# Patient Record
Sex: Female | Born: 1990 | Race: White | Hispanic: No | Marital: Single | Smoking: Never smoker
Health system: Southern US, Community
[De-identification: ages and names within clinical notes are randomized; demographics above are authoritative.]

## PROBLEM LIST (undated history)

## (undated) DIAGNOSIS — Z8669 Personal history of other diseases of the nervous system and sense organs: Secondary | ICD-10-CM

## (undated) DIAGNOSIS — F840 Autistic disorder: Secondary | ICD-10-CM

## (undated) HISTORY — PX: TYMPANOSTOMY TUBE PLACEMENT: SHX32

## (undated) HISTORY — PX: TONSILLECTOMY AND ADENOIDECTOMY: SUR1326

## (undated) HISTORY — DX: Personal history of other diseases of the nervous system and sense organs: Z86.69

---

## 2013-05-08 ENCOUNTER — Encounter: Payer: Self-pay | Admitting: *Deleted

## 2013-05-08 ENCOUNTER — Emergency Department
Admission: EM | Admit: 2013-05-08 | Discharge: 2013-05-08 | Disposition: A | Discharge status: Home / Self Care | Payer: Managed Care, Other (non HMO) | Source: Home / Self Care | Attending: Emergency Medicine | Admitting: Emergency Medicine

## 2013-05-08 DIAGNOSIS — H66009 Acute suppurative otitis media without spontaneous rupture of ear drum, unspecified ear: Secondary | ICD-10-CM

## 2013-05-08 DIAGNOSIS — H66001 Acute suppurative otitis media without spontaneous rupture of ear drum, right ear: Secondary | ICD-10-CM

## 2013-05-08 DIAGNOSIS — J0100 Acute maxillary sinusitis, unspecified: Secondary | ICD-10-CM

## 2013-05-08 HISTORY — DX: Autistic disorder: F84.0

## 2013-05-08 MED ORDER — AMOXICILLIN-POT CLAVULANATE 400-57 MG/5ML PO SUSR: 500.0000 mg | Freq: Three times a day (TID) | ORAL | Status: AC

## 2013-05-08 NOTE — ED Notes (Signed)
History obtained by patient's mother. Fahmida's mother reports she has been c/o ear pain x 3 days. She is also congested with runny nose. Denies fever.

## 2013-05-09 NOTE — ED Provider Notes (Signed)
History     CSN: [id number]  Arrival date & time 05/08/13  1120   First MD Initiated Contact with Patient 05/08/13 1134      Chief Complaint  Patient presents with  . Otalgia     History provided by: Patient's mother, as patient is autistic.   URI HISTORY  Crystal Cline is a 22 y.o. female ,onset of cold symptoms for 3 days. Sinus congestion, colored rhinorrhea, right ear pain. Minimal sore throat  Tried Mucinex, not helping. Mother tried Nettie pot, but patient would not tolerate. Family moved here recently from [place]. History of ear wax buildup, was seeing an ENT for periodic ear wax removal.  No chills/sweats No Fever  +  Nasal congestion +  Discolored Post-nasal drainage Positive, mild, sinus pain/pressure Mild sore throat  No cough No wheezing No chest congestion No hemoptysis No shortness of breath No pleuritic pain  No itchy/red eyes No earache  No nausea No vomiting No abdominal pain No diarrhea  No skin rashes + mild Fatigue No myalgias No headache   Past Medical History  Diagnosis Date  . Autism     Past Surgical History  Procedure Laterality Date  . Tonsillectomy    . Tonsillectomy and adenoidectomy      History reviewed. No pertinent family history.  History  Substance Use Topics  . Smoking status: Never Smoker   . Smokeless tobacco: Never Used  . Alcohol Use: No    OB History   Grav Para Term Preterm Abortions TAB SAB Ect Mult Living                  Review of Systems  All other systems reviewed and are negative.  see above  Allergies  Review of patient's allergies indicates no known allergies.  Home Medications   Current Outpatient Rx  Name  Route  Sig  Dispense  Refill  . amoxicillin-clavulanate (AUGMENTIN) 400-57 MG/5ML suspension   Oral   Take 6.3 mLs (500 mg total) by mouth 3 (three) times daily. Take for 10 days. Take with food.   200 mL   0     BP 112/79  Pulse 111  Temp(Src) 98.6 F (37 C) (Oral)  Resp  16  Ht 5' 1.5" (1.562 m)  Wt 142 lb (64.411 kg)  BMI 26.4 kg/m2  SpO2 97%  Physical Exam  Nursing note and vitals reviewed. Constitutional: She is oriented to person, place, and time. She appears well-developed and well-nourished. No distress.  HENT:  Head: Normocephalic and atraumatic.  Right Ear: External ear and ear canal normal. Tympanic membrane is injected, erythematous and bulging.  Left Ear: Tympanic membrane, external ear and ear canal normal.  Nose: Mucosal edema and rhinorrhea present.  Mouth/Throat: Oropharynx is clear and moist. No oral lesions.  Eyes: Conjunctivae are normal. No scleral icterus.  Neck: Neck supple.  Cardiovascular: Normal rate, regular rhythm and normal heart sounds.   Pulmonary/Chest: Effort normal and breath sounds normal.  Lymphadenopathy:    She has no cervical adenopathy.  Neurological: She is alert and oriented to person, place, and time.  Skin: Skin is warm and dry.   Nose: Edematous mucosa, seromucoid yellow drainage. No foreign body. Mild tenderness bilateral maxillary. Left ear: Mild amount of wax in canal, but TM normal ED Course  Procedures (including critical care time)  Labs Reviewed - No data to display No results found.   1. Right acute suppurative otitis media   2. Sinusitis, acute maxillary  MDM  Diagnosis and treatment options discussed with mother. After risks, benefits, alternatives discussed, prescribed Augmentin, 500 mg 3 times a day p.c. x10 days. Other pain reduction methods discussed. Mother declined using decongestants because of side effects in the past. I offered prescription for steroid nasal spray, but mother declined. Advised  one spray of Afrin in each nostril each night for the next 3 nights, but not for chronic use and precautions discussed. Followup with PCP if no better in one week, sooner if worse or new symptoms. Also, advised to establish with ENT because of her history of chronic ear wax  buildup.--Mother voiced understanding and agreement with above.        Lajean Manes, MD 05/09/13 [phone]

## 2013-05-19 ENCOUNTER — Encounter: Payer: Self-pay | Admitting: Family Medicine

## 2013-05-19 ENCOUNTER — Ambulatory Visit (INDEPENDENT_AMBULATORY_CARE_PROVIDER_SITE_OTHER): Payer: Managed Care, Other (non HMO) | Admitting: Family Medicine

## 2013-05-19 VITALS — BP 110/79 | HR 91 | Ht 62.0 in | Wt 142.0 lb

## 2013-05-19 DIAGNOSIS — B373 Candidiasis of vulva and vagina: Secondary | ICD-10-CM

## 2013-05-19 DIAGNOSIS — B3731 Acute candidiasis of vulva and vagina: Secondary | ICD-10-CM

## 2013-05-19 DIAGNOSIS — H6120 Impacted cerumen, unspecified ear: Secondary | ICD-10-CM

## 2013-05-19 DIAGNOSIS — H6123 Impacted cerumen, bilateral: Secondary | ICD-10-CM

## 2013-05-19 MED ORDER — TERCONAZOLE 0.8 % VA CREA: 1.0000 | TOPICAL_CREAM | Freq: Every day | VAGINAL | Status: DC

## 2013-05-19 MED ORDER — FLUCONAZOLE 150 MG PO TABS: 150.0000 mg | ORAL_TABLET | Freq: Once | ORAL | Status: DC

## 2013-05-19 NOTE — Patient Instructions (Addendum)
Cerumen Plug A cerumen plug is having too much wax in your ear canal. The outer ear canal is lined with hairs and glands that secrete wax. This wax is called cerumen. This protects the ear canal. It also helps prevent material from entering the ear. Too much wax can cause a feeling of fullness in the ears, decreased hearing, ringing in the ears, or an earache. Sometimes your caregiver will remove a cerumen plug with an instrument called a curette. Or he/she may flush the ear canal with warm water from a syringe to remove the wax. You may simply be sent home to follow the home care instructions below for wax removal. Generally ear wax does not have to be removed unless it is causing a problem such as one of those listed above. When too much wax is causing a problem, the following are a few home remedies which can be used to help this problem. HOME CARE INSTRUCTIONS   Put a couple drops of glycerin, baby oil, or mineral oil in the ear a couple times of day. Do this every day for several days. After putting the drops in, you will need to lay with the affected ear pointing up for a couple minutes. This allows the drops to remain in the canal and run down to the area of wax blockage. This will soften the wax plug. It may also make your hearing worse as the wax softens and blocks the canal even more.  After a couple days, you may gently flush the ear canal with warm water from a syringe. Do this by pulling your ear up and back with your head tilted slightly forward and towards a pan to catch the water. This is most easily done with a helper. You can also accomplish the same thing by letting the shower beat into your ear canal to wash the wax out. Sometimes this will not be immediately successful. You will have to return to the first step of using the oil to further soften the wax. Then resume washing the ear canal out with a syringe or shower.  Following removal of the wax, put ten to twenty drops of rubbing  alcohol into the outer ears. This will dry the canal and prevent an infection.  Do not irrigate or wash out your ears if you have had a perforated ear drum or mastoid surgery. SEEK IMMEDIATE MEDICAL CARE IF:   You are unsuccessful with the above instructions for home care.  You develop ear pain or drainage from the ear. MAKE SURE YOU:   Understand these instructions.  Will watch your condition.  Will get help right away if you are not doing well or get worse. Document Released: 08/14/2001 Document Revised: 02/11/2012 Document Reviewed: 11/10/2008 [employer] Patient Information 2014 [employer].  

## 2013-05-19 NOTE — Progress Notes (Signed)
  Subjective:    Patient ID: Crystal Cline, female    DOB: August 17, 1991, 22 y.o.   MRN: [id number]  HPI  Sahory is here today with her mother Clydie Braun) to establish care with our practice.  She and her family moved to the area recently from [place].  Her father was transferred here with [facility].  She was recently seen at the [facility] for ear pain and was told that she had a ruptured eardrum.   She was given a round of Amoxicilin and was instructed to follow up with a PCP.    Review of Systems  HENT: Positive for ear pain and ear discharge. Negative for hearing loss.      Past Medical History  Diagnosis Date  . Autism   . History of impacted cerumen      Family History  Problem Relation Age of Onset  . Allergies Mother   . Allergies Father   . Cancer Maternal Grandfather   . Cancer Paternal Grandmother   . Cancer Paternal Grandfather    History   Social History Narrative   Marital Status: Single    Children:  None    Pets: None    Living Situation: Lives with parents Loraine Leriche and Clydie Braun)    Education:  She was home schooled due to her special needs (Autism)    Tobacco Use/Exposure:  None    Alcohol Use: None   Drug Use:  None   Diet:  Regular   Exercise:  Bicycle  (6 miles daily- 6 x per week)     Hobbies: Puzzles and Gardening                   Objective:   Physical Exam  Constitutional: She appears well-nourished.  HENT:  Cerumen is present in both ear canals   Eyes: Conjunctivae are normal.  Neck: Neck supple. No thyromegaly present.  Cardiovascular: Normal rate, regular rhythm and normal heart sounds.   Pulmonary/Chest: Effort normal and breath sounds normal.  Skin: Skin is warm and dry.  Psychiatric:  She is autistic (very sweet and happy). She is cooperative.            Assessment & Plan:

## 2013-06-27 ENCOUNTER — Encounter: Payer: Self-pay | Admitting: Family Medicine

## 2013-06-27 DIAGNOSIS — H6123 Impacted cerumen, bilateral: Secondary | ICD-10-CM | POA: Insufficient documentation

## 2013-06-27 DIAGNOSIS — B373 Candidiasis of vulva and vagina: Secondary | ICD-10-CM | POA: Insufficient documentation

## 2013-06-27 DIAGNOSIS — B3731 Acute candidiasis of vulva and vagina: Secondary | ICD-10-CM | POA: Insufficient documentation

## 2013-06-27 NOTE — Assessment & Plan Note (Signed)
Crystal Cline has had this problem for many years and has previously been followed by an ENT to remove her cerumen.  It is located deeper than we should attempt so mom will check with their insurance and follow up with an ENT.

## 2013-06-27 NOTE — Assessment & Plan Note (Signed)
Since she has just completed an antibiotic, mom is to watch her for any sign of a yeast infection.

## 2013-10-05 ENCOUNTER — Encounter (INDEPENDENT_AMBULATORY_CARE_PROVIDER_SITE_OTHER): Payer: Self-pay

## 2013-10-05 ENCOUNTER — Ambulatory Visit (INDEPENDENT_AMBULATORY_CARE_PROVIDER_SITE_OTHER): Payer: Managed Care, Other (non HMO) | Admitting: Family Medicine

## 2013-10-05 ENCOUNTER — Encounter: Payer: Self-pay | Admitting: Family Medicine

## 2013-10-05 VITALS — BP 100/68 | HR 96 | Resp 16 | Wt 146.0 lb

## 2013-10-05 DIAGNOSIS — Z124 Encounter for screening for malignant neoplasm of cervix: Secondary | ICD-10-CM

## 2013-10-05 DIAGNOSIS — N921 Excessive and frequent menstruation with irregular cycle: Secondary | ICD-10-CM

## 2013-10-05 DIAGNOSIS — Z01419 Encounter for gynecological examination (general) (routine) without abnormal findings: Secondary | ICD-10-CM | POA: Insufficient documentation

## 2013-10-05 DIAGNOSIS — N920 Excessive and frequent menstruation with regular cycle: Secondary | ICD-10-CM

## 2013-10-05 NOTE — Progress Notes (Signed)
  Subjective:    Patient ID: Crystal Cline, female    DOB: 1990-12-27, 22 y.o.   MRN: [id number]  HPI  Crystal Cline is here today with her mother to discuss her menstrual cycles.  They are very long and she has spotting in between.  She has had this problem for years. Mom is concerned because her excessive bleeding seems to be worsening.  Mom would like for [facility] to have a pelvic exam.  She is concerned about Abella because of her family history of cervical vaginal and uterine problems.    Review of Systems  Constitutional: Negative.   HENT: Negative.   Eyes: Negative.   Respiratory: Negative.   Cardiovascular: Negative.   Gastrointestinal: Negative.   Endocrine: Negative.   Genitourinary: Positive for vaginal bleeding.  Musculoskeletal: Negative.   Skin: Negative.   Allergic/Immunologic: Negative.   Neurological: Negative.   Hematological: Negative.   Psychiatric/Behavioral: Negative.      Past Medical History  Diagnosis Date  . Autism   . History of impacted cerumen      Family History  Problem Relation Age of Onset  . Allergies Mother   . Allergies Father   . Cancer Maternal Grandfather   . Cancer Paternal Grandmother   . Cancer Paternal Grandfather      History   Social History Narrative   Marital Status: Single    Children:  None    Pets: None    Living Situation: Lives with parents Loraine Leriche and Clydie Braun)    Education:  She was home schooled due to her special needs (Autism)    Tobacco Use/Exposure:  None    Alcohol Use: None   Drug Use:  None   Diet:  Regular   Exercise:  Bicycle  (6 miles daily- 6 x per week)     Hobbies: Puzzles and Gardening                 Objective:   Physical Exam  Vitals reviewed. Constitutional: She is oriented to person, place, and time. She appears well-developed and well-nourished.  HENT:  Head: Normocephalic and atraumatic.  Right Ear: External ear normal.  Left Ear: External ear normal.  Nose: Nose normal.   Mouth/Throat: Oropharynx is clear and moist.  Eyes: Conjunctivae are normal. Pupils are equal, round, and reactive to light. No scleral icterus.  Neck: Normal range of motion. Neck supple. No thyromegaly present.  Cardiovascular: Normal rate, regular rhythm, normal heart sounds and intact distal pulses.  Exam reveals no gallop and no friction rub.   No murmur heard. Pulmonary/Chest: Effort normal and breath sounds normal.  Abdominal: Soft. Bowel sounds are normal.  Genitourinary: Vagina normal and uterus normal.  Musculoskeletal: Normal range of motion. She exhibits no edema and no tenderness.  Lymphadenopathy:    She has no cervical adenopathy.  Neurological: She is alert and oriented to person, place, and time. She has normal reflexes.  Skin: Skin is warm and dry.  Psychiatric: She has a normal mood and affect. Her behavior is normal. Judgment and thought content normal.      Assessment & Plan:    Jaxon was seen today for gynecologic exam.  Diagnoses and associated orders for this visit:  Routine gynecological examination  Screening for malignant neoplasm of the cervix - Cytology - PAP  Menorrhagia with irregular cycle Comments: We discussed hormonal options to help control her bleeding.

## 2013-10-25 ENCOUNTER — Other Ambulatory Visit (HOSPITAL_COMMUNITY)
Admission: RE | Admit: 2013-10-25 | Discharge: 2013-10-25 | Disposition: A | Discharge status: Home / Self Care | Payer: Managed Care, Other (non HMO) | Source: Ambulatory Visit | Attending: Family Medicine | Admitting: Family Medicine

## 2013-12-06 DIAGNOSIS — Z01419 Encounter for gynecological examination (general) (routine) without abnormal findings: Secondary | ICD-10-CM | POA: Insufficient documentation

## 2013-12-06 DIAGNOSIS — N921 Excessive and frequent menstruation with irregular cycle: Secondary | ICD-10-CM | POA: Insufficient documentation

## 2013-12-06 DIAGNOSIS — Z124 Encounter for screening for malignant neoplasm of cervix: Secondary | ICD-10-CM | POA: Insufficient documentation

## 2014-01-11 ENCOUNTER — Ambulatory Visit: Payer: Managed Care, Other (non HMO) | Admitting: Family Medicine

## 2014-01-11 ENCOUNTER — Other Ambulatory Visit (INDEPENDENT_AMBULATORY_CARE_PROVIDER_SITE_OTHER): Payer: Managed Care, Other (non HMO) | Admitting: *Deleted

## 2014-01-11 DIAGNOSIS — M25559 Pain in unspecified hip: Secondary | ICD-10-CM

## 2014-01-11 LAB — POCT URINALYSIS DIPSTICK
Bilirubin, UA: NEGATIVE
Blood, UA: NEGATIVE
Glucose, UA: NEGATIVE
Ketones, UA: NEGATIVE
Leukocytes, UA: NEGATIVE
Nitrite, UA: NEGATIVE
Protein, UA: NEGATIVE
Spec Grav, UA: 1.015
Urobilinogen, UA: NEGATIVE
pH, UA: 6

## 2014-01-12 ENCOUNTER — Ambulatory Visit: Payer: Managed Care, Other (non HMO) | Admitting: Family Medicine

## 2014-01-12 ENCOUNTER — Ambulatory Visit (INDEPENDENT_AMBULATORY_CARE_PROVIDER_SITE_OTHER): Payer: Managed Care, Other (non HMO) | Admitting: Physician Assistant

## 2014-01-12 ENCOUNTER — Encounter: Payer: Self-pay | Admitting: Physician Assistant

## 2014-01-12 VITALS — BP 96/62 | HR 83 | Ht 62.0 in | Wt 138.0 lb

## 2014-01-12 DIAGNOSIS — F4322 Adjustment disorder with anxiety: Secondary | ICD-10-CM

## 2014-01-12 DIAGNOSIS — N939 Abnormal uterine and vaginal bleeding, unspecified: Secondary | ICD-10-CM

## 2014-01-12 DIAGNOSIS — N926 Irregular menstruation, unspecified: Secondary | ICD-10-CM

## 2014-01-12 DIAGNOSIS — R351 Nocturia: Secondary | ICD-10-CM

## 2014-01-12 DIAGNOSIS — F840 Autistic disorder: Secondary | ICD-10-CM

## 2014-01-12 DIAGNOSIS — F88 Other disorders of psychological development: Secondary | ICD-10-CM | POA: Insufficient documentation

## 2014-01-12 DIAGNOSIS — L680 Hirsutism: Secondary | ICD-10-CM

## 2014-01-12 DIAGNOSIS — M2629 Other anomalies of dental arch relationship: Secondary | ICD-10-CM

## 2014-01-12 LAB — CBC WITH DIFFERENTIAL/PLATELET
BASOS PCT: 0 % (ref 0–1)
Basophils Absolute: 0 10*3/uL (ref 0.0–0.1)
EOS ABS: 0.1 10*3/uL (ref 0.0–0.7)
Eosinophils Relative: 2 % (ref 0–5)
HCT: 41.6 % (ref 36.0–46.0)
HEMOGLOBIN: 14 g/dL (ref 12.0–15.0)
LYMPHS ABS: 2.6 10*3/uL (ref 0.7–4.0)
Lymphocytes Relative: 47 % — ABNORMAL HIGH (ref 12–46)
MCH: 30.1 pg (ref 26.0–34.0)
MCHC: 33.7 g/dL (ref 30.0–36.0)
MCV: 89.5 fL (ref 78.0–100.0)
Monocytes Absolute: 0.4 10*3/uL (ref 0.1–1.0)
Monocytes Relative: 7 % (ref 3–12)
NEUTROS ABS: 2.4 10*3/uL (ref 1.7–7.7)
NEUTROS PCT: 44 % (ref 43–77)
PLATELETS: 303 10*3/uL (ref 150–400)
RBC: 4.65 MIL/uL (ref 3.87–5.11)
RDW: 14 % (ref 11.5–15.5)
WBC: 5.6 10*3/uL (ref 4.0–10.5)

## 2014-01-12 LAB — POCT URINALYSIS DIPSTICK
Bilirubin, UA: NEGATIVE
Glucose, UA: NEGATIVE
Ketones, UA: NEGATIVE
Nitrite, UA: NEGATIVE
PROTEIN UA: NEGATIVE
UROBILINOGEN UA: 0.2
pH, UA: 5

## 2014-01-12 LAB — COMPLETE METABOLIC PANEL WITH GFR
ALT: 11 U/L (ref 0–35)
AST: 13 U/L (ref 0–37)
Albumin: 4.5 g/dL (ref 3.5–5.2)
Alkaline Phosphatase: 52 U/L (ref 39–117)
BILIRUBIN TOTAL: 0.4 mg/dL (ref 0.2–1.2)
BUN: 12 mg/dL (ref 6–23)
CALCIUM: 9.7 mg/dL (ref 8.4–10.5)
CHLORIDE: 106 meq/L (ref 96–112)
CO2: 23 meq/L (ref 19–32)
CREATININE: 0.55 mg/dL (ref 0.50–1.10)
GLUCOSE: 81 mg/dL (ref 70–99)
Potassium: 4 mEq/L (ref 3.5–5.3)
Sodium: 137 mEq/L (ref 135–145)
Total Protein: 7.1 g/dL (ref 6.0–8.3)

## 2014-01-12 LAB — LIPID PANEL
CHOL/HDL RATIO: 3.6 ratio
Cholesterol: 178 mg/dL (ref 0–200)
HDL: 49 mg/dL (ref >39)
LDL CALC: 100 mg/dL — AB (ref 0–99)
Triglycerides: 143 mg/dL (ref <150)
VLDL: 29 mg/dL (ref 0–40)

## 2014-01-12 MED ORDER — NITROFURANTOIN MONOHYD MACRO 100 MG PO CAPS: 100.0000 mg | ORAL_CAPSULE | Freq: Two times a day (BID) | ORAL | Status: DC

## 2014-01-12 NOTE — Progress Notes (Signed)
Subjective:    Patient ID: Crystal Cline, female    DOB: 1991-04-07, 23 y.o.   MRN: [id number]  HPI Pt is a 23 yo female who presents to the clinic with her mother to establish care. Pt does have developmental delay and characterized by autism. Her mother does most of the talking for her. Mother has a few concerns today. Pt does not take any medications daily.   . Active Ambulatory Problems    Diagnosis Date Noted  . Menorrhagia with irregular cycle 12/06/2013  . Global developmental delay 01/12/2014  . Autism 01/12/2014  . Adjustment disorder with anxious mood 01/12/2014  . Overbite 01/12/2014   Resolved Ambulatory Problems    Diagnosis Date Noted  . Impacted cerumen of both ears 06/27/2013  . Candidiasis of vulva 06/27/2013  . Routine gynecological examination 12/06/2013  . Screening for malignant neoplasm of the cervix 12/06/2013   Past Medical History  Diagnosis Date  . History of impacted cerumen    . History   Social History  . Marital Status: Single    Spouse Name: N/A    Number of Children: N/A  . Years of Education: N/A   Occupational History  . Not on file.   Social History Main Topics  . Smoking status: Never Smoker   . Smokeless tobacco: Never Used  . Alcohol Use: No  . Drug Use: No  . Sexual Activity: No   Other Topics Concern  . Not on file   Social History Narrative   Marital Status: Single    Children:  None    Pets: None    Living Situation: Lives with parents [name] and [name])    Education:  She was home schooled due to her special needs (Autism)    Tobacco Use/Exposure:  None    Alcohol Use: None   Drug Use:  None   Diet:  Regular   Exercise:  Bicycle  (6 miles daily- 6 x per week)     Hobbies: Puzzles and Gardening             . Family History  Problem Relation Age of Onset  . Allergies Mother   . Allergies Father   . Cancer Maternal Grandfather   . Cancer Paternal Grandmother   . Cancer Paternal Grandfather     Pt has  been having some concerning urinary symptoms. For the last 3 months pt has been waking up multiple times a night and urinating. Pt declines any burning or pain until the last week or so. Pt does not communicate well but has been holding her lower abdomen recently. Her urinary symptoms seem to be worse at night. Now she complains of not being able to urinate quite as much. Every time she urinates it is a struggle and then a little bit comes out. Denies any back pain, fever, chills, nausea, vomiting. She has had a headache for a day or so. Denies any vision changes. Not tried anything to make better and nothing makes worse. Mother is concerned due to family hx of ovarian cancer and diabetes. Pt is also having irregular periods. Most of the time they are too early sometimes too late. Denies any pelvic pain or discharge. Mother states she does have some abnormal hair growth on chin.  Review of Systems  All other systems reviewed and are negative.       Objective:   Physical Exam  Constitutional: She is oriented to person, place, and time. She appears well-developed and well-nourished.  HENT:  Head: Normocephalic and atraumatic.  Eyes: Conjunctivae and EOM are normal. Pupils are equal, round, and reactive to light. Right eye exhibits no discharge. Left eye exhibits no discharge.  Neck: Normal range of motion. Neck supple. No thyromegaly present.  Cardiovascular: Normal rate, regular rhythm and normal heart sounds.   Pulmonary/Chest: Effort normal and breath sounds normal. She has no wheezes.  NO CVA tenderness.   Abdominal: Soft. Bowel sounds are normal. She exhibits no distension and no mass. There is no tenderness. There is no rebound and no guarding.  Lymphadenopathy:    She has no cervical adenopathy.  Neurological: She is alert and oriented to person, place, and time.  Skin: Skin is dry.  Psychiatric: She has a normal mood and affect. Her behavior is normal.  Pt did not talk. Mother did all  talking.           Assessment & Plan:  Excessive urination at night/low urine output- UA positive for blood and pus. No glucose in urine. Would still like urine culture and mircoscopic. Will treat for infection since symptomatic with macrobid. Would also like to screen for diabetes and kidney function. Per mother she has appt with urologist on Friday. I am ok with this. We could do further testing but if she would like to go to urologist I think that is fine.   Irregular periods/hirsulitism- concerned PCOS might also be a factor. Ordering bloodwork to determine if any hormones are causing issues. Checking multiple hormones from pituitary gland to see if there might be a reason all these random symptoms started happening at the same time. Pt is under a lot of stress right now cannot disregard that hormones are still regulating. Mother does not like idea of OCP. I would get pelvic u/s but will wait since pt going to specialist this week.

## 2014-01-13 LAB — URINALYSIS, MICROSCOPIC ONLY: CASTS: NONE SEEN

## 2014-01-13 LAB — TSH: TSH: 0.771 u[IU]/mL (ref 0.350–4.500)

## 2014-01-13 LAB — TESTOSTERONE, FREE, TOTAL, SHBG
Sex Hormone Binding: 50 nmol/L (ref 18–114)
TESTOSTERONE FREE: 10.2 pg/mL — AB (ref 0.6–6.8)
TESTOSTERONE: 73 ng/dL — AB (ref 10–70)
Testosterone-% Free: 1.4 % (ref 0.4–2.4)

## 2014-01-13 LAB — DHEA-SULFATE: DHEA SO4: 249 ug/dL (ref 35–430)

## 2014-01-13 LAB — PROLACTIN: PROLACTIN: 12.1 ng/mL

## 2014-01-14 LAB — URINE CULTURE: Colony Count: 50000

## 2014-01-20 ENCOUNTER — Other Ambulatory Visit: Payer: Self-pay | Admitting: Physician Assistant

## 2014-01-20 ENCOUNTER — Telehealth: Payer: Self-pay | Admitting: *Deleted

## 2014-01-20 DIAGNOSIS — R7989 Other specified abnormal findings of blood chemistry: Secondary | ICD-10-CM

## 2014-01-20 DIAGNOSIS — N921 Excessive and frequent menstruation with irregular cycle: Secondary | ICD-10-CM

## 2014-01-20 NOTE — Progress Notes (Signed)
Mother called into office. Nothing was able to be done GYN wise with specialist she was only urologist. Per elevated testosterone and menstrual abnormalities will proceed with pelvic ultrasound.

## 2014-01-21 ENCOUNTER — Ambulatory Visit (INDEPENDENT_AMBULATORY_CARE_PROVIDER_SITE_OTHER): Payer: Managed Care, Other (non HMO)

## 2014-01-21 ENCOUNTER — Ambulatory Visit: Payer: Managed Care, Other (non HMO)

## 2014-01-21 DIAGNOSIS — N920 Excessive and frequent menstruation with regular cycle: Secondary | ICD-10-CM

## 2014-01-21 DIAGNOSIS — E349 Endocrine disorder, unspecified: Secondary | ICD-10-CM

## 2014-01-22 ENCOUNTER — Other Ambulatory Visit: Payer: Self-pay | Admitting: Physician Assistant

## 2014-01-22 DIAGNOSIS — R7989 Other specified abnormal findings of blood chemistry: Secondary | ICD-10-CM

## 2014-01-22 DIAGNOSIS — N921 Excessive and frequent menstruation with irregular cycle: Secondary | ICD-10-CM

## 2014-01-25 ENCOUNTER — Ambulatory Visit (INDEPENDENT_AMBULATORY_CARE_PROVIDER_SITE_OTHER): Payer: Managed Care, Other (non HMO) | Admitting: Physician Assistant

## 2014-01-25 ENCOUNTER — Encounter: Payer: Self-pay | Admitting: Physician Assistant

## 2014-01-25 VITALS — BP 96/67 | HR 99 | Wt 139.0 lb

## 2014-01-25 DIAGNOSIS — J019 Acute sinusitis, unspecified: Secondary | ICD-10-CM

## 2014-01-25 DIAGNOSIS — K5900 Constipation, unspecified: Secondary | ICD-10-CM

## 2014-01-25 DIAGNOSIS — R51 Headache: Secondary | ICD-10-CM

## 2014-01-25 DIAGNOSIS — R519 Headache, unspecified: Secondary | ICD-10-CM

## 2014-01-25 MED ORDER — AMOXICILLIN 500 MG PO CAPS: 500.0000 mg | ORAL_CAPSULE | Freq: Two times a day (BID) | ORAL | Status: DC

## 2014-01-25 NOTE — Patient Instructions (Addendum)
Advil/Motrin 200-800mg  as needed up to three times a day.   Eye doctor appt.   miralax 1 capsule daily. Increasing fiber diet. Drinking adequate water. Avoidance cheese, dairy.   Start amoxil for 10 days.   Constipation, Adult Constipation is when a person has fewer than 3 bowel movements a week; has difficulty having a bowel movement; or has stools that are dry, hard, or larger than normal. As people grow older, constipation is more common. If you try to fix constipation with medicines that make you have a bowel movement (laxatives), the problem may get worse. Long-term laxative use may cause the muscles of the colon to become weak. A low-fiber diet, not taking in enough fluids, and taking certain medicines may make constipation worse. CAUSES   Certain medicines, such as antidepressants, pain medicine, iron supplements, antacids, and water pills.   Certain diseases, such as diabetes, irritable bowel syndrome (IBS), thyroid disease, or depression.   Not drinking enough water.   Not eating enough fiber-rich foods.   Stress or travel.  Lack of physical activity or exercise.  Not going to the restroom when there is the urge to have a bowel movement.  Ignoring the urge to have a bowel movement.  Using laxatives too much. SYMPTOMS   Having fewer than 3 bowel movements a week.   Straining to have a bowel movement.   Having hard, dry, or larger than normal stools.   Feeling full or bloated.   Pain in the lower abdomen.  Not feeling relief after having a bowel movement. DIAGNOSIS  Your caregiver will take a medical history and perform a physical exam. Further testing may be done for severe constipation. Some tests may include:   A barium enema X-ray to examine your rectum, colon, and sometimes, your small intestine.  A sigmoidoscopy to examine your lower colon.  A colonoscopy to examine your entire colon. TREATMENT  Treatment will depend on the severity of your  constipation and what is causing it. Some dietary treatments include drinking more fluids and eating more fiber-rich foods. Lifestyle treatments may include regular exercise. If these diet and lifestyle recommendations do not help, your caregiver may recommend taking over-the-counter laxative medicines to help you have bowel movements. Prescription medicines may be prescribed if over-the-counter medicines do not work.  HOME CARE INSTRUCTIONS   Increase dietary fiber in your diet, such as fruits, vegetables, whole grains, and beans. Limit high-fat and processed sugars in your diet, such as [place] fries, hamburgers, cookies, candies, and soda.   A fiber supplement may be added to your diet if you cannot get enough fiber from foods.   Drink enough fluids to keep your urine clear or pale yellow.   Exercise regularly or as directed by your caregiver.   Go to the restroom when you have the urge to go. Do not hold it.  Only take medicines as directed by your caregiver. Do not take other medicines for constipation without talking to your caregiver first. [place] IF:   You have bright red blood in your stool.   Your constipation lasts for more than 4 days or gets worse.   You have abdominal or rectal pain.   You have thin, pencil-like stools.  You have unexplained weight loss. MAKE SURE YOU:   Understand these instructions.  Will watch your condition.  Will get help right away if you are not doing well or get worse. Document Released: 08/17/2004 Document Revised: 02/11/2012 Document Reviewed: 08/31/2013 ExitCare Patient Information  2014 [place], [place].

## 2014-01-25 NOTE — Progress Notes (Signed)
   Subjective:    Patient ID: Crystal Cline, female    DOB: Aug 10, 1991, 23 y.o.   MRN: [id number]  HPI Pt is a 23 yo female with autism that has some problems communicating. She comes in with her mother. Mother reports that she has been complaining of headache for 3 weeks and started to become worrisome. Headaches seem to be located right on top of head and in the frontal region(this is where she points). This weekend she has felt nauseated all weekend but not vomited. Not taken temperature but has not felt hot. She has had cough and complained of ST. Denies any ear pain. Tried tylenol which has helped some. HA is constant there is no break. Not been to eye doctor in over a year. Mother admits to her seemingly have and URI infection 2 weeks ago that seemed to resolve but left her with a headache. Mother is also concerned because she has noticed her bowel movements are not regular and harder than they should be. Miralax did help and she has had some soft BM that are at least every other day.    Review of Systems     Objective:   Physical Exam  Constitutional: She is oriented to person, place, and time. She appears well-developed and well-nourished.  HENT:  Head: Normocephalic and atraumatic.  Right Ear: External ear normal.  Left Ear: External ear normal.  Nose: Nose normal.  Mouth/Throat: Oropharynx is clear and moist.  TM's clear bilaterally.   No reaction when palpitated over areas of headache.     Eyes: Conjunctivae and EOM are normal. Pupils are equal, round, and reactive to light. Right eye exhibits no discharge. Left eye exhibits no discharge.  Neck: Normal range of motion. Neck supple.  Cardiovascular: Normal rate, regular rhythm and normal heart sounds.   Pulmonary/Chest: Effort normal and breath sounds normal. She has no wheezes.  Lymphadenopathy:    She has no cervical adenopathy.  Neurological: She is alert and oriented to person, place, and time.  Psychiatric: She has a  normal mood and affect. Her behavior is normal.          Assessment & Plan:  Headache/sinusitis- sounds like pt could have some sinusitis. Since such acute onset but lasting everyday not a migraine. It is hard to get an accurate hx from pt. When ask question mother interprets but pt does not necessarily answer. Will give amoxicillin for 10 days. Suggested to try ibuprofen for HA and symptoms. I would like for pt to make eye doctor appt to make sure pt is not putting strain on eyes that making headaches more frequent.follow up in 4 weeks.    Constipation- Try high fiber diet, adequate water, miralax daily or as needed. If not having regular soft bowel movements follow up. Also watch foods that can constipate. Consider probiotics that can help GI symptoms run smoothly. Gave sample of probiotic in same closet [place].    Spent 30 minutes with patient and greater than 50 percent of visit spent discussing proper protocol of headaches.

## 2014-02-25 ENCOUNTER — Ambulatory Visit (INDEPENDENT_AMBULATORY_CARE_PROVIDER_SITE_OTHER): Payer: Managed Care, Other (non HMO) | Admitting: Family Medicine

## 2014-02-25 ENCOUNTER — Encounter: Payer: Self-pay | Admitting: Family Medicine

## 2014-02-25 VITALS — BP 115/78 | HR 101 | Temp 98.1°F | Wt 141.0 lb

## 2014-02-25 DIAGNOSIS — H6120 Impacted cerumen, unspecified ear: Secondary | ICD-10-CM

## 2014-02-25 DIAGNOSIS — H9209 Otalgia, unspecified ear: Secondary | ICD-10-CM

## 2014-02-25 NOTE — Progress Notes (Signed)
   Subjective:    Patient ID: Crystal Cline, female    DOB: 1991/06/14, 23 y.o.   MRN: [id number]  HPI Ear pain started last night.  Mom says sometimes says ears get blocked.  No fever, chills or sweats. In the past they were going to ear nose and throat specialist yearly to have the wax removed. No other cold symptoms.   Review of Systems     Objective:   Physical Exam  Constitutional: She is oriented to person, place, and time. She appears well-developed and well-nourished.  HENT:  Head: Normocephalic and atraumatic.  Right Ear: External ear normal.  Left Ear: External ear normal.  Nose: Nose normal.  Mouth/Throat: Oropharynx is clear and moist.  Tympanic membranes look great with good light reflex. No scar tissue. Canals are clear. No fluid or erythema.  Eyes: Conjunctivae are normal. Pupils are equal, round, and reactive to light.  Neck: Neck supple.  Neurological: She is alert and oriented to person, place, and time.  Skin: Skin is warm and dry.  Psychiatric: She has a normal mood and affect. Her behavior is normal.          Assessment & Plan:  Otalgia secondary to cerumen impaction. Irrigation performed today. Patient tolerated very well. Her exam is completely normal afterwards. We discussed using wax softening drops once or twice a week for maintenance. Also would be happy to refer her to ENT in a year for next cleaning if desired.

## 2014-02-25 NOTE — Patient Instructions (Signed)
Debrox softening drops 1 or 2 times a week.

## 2014-03-23 ENCOUNTER — Encounter: Payer: Self-pay | Admitting: Physician Assistant

## 2014-03-23 ENCOUNTER — Ambulatory Visit (INDEPENDENT_AMBULATORY_CARE_PROVIDER_SITE_OTHER): Payer: Managed Care, Other (non HMO) | Admitting: Physician Assistant

## 2014-03-23 VITALS — BP 121/81 | HR 95 | Wt 138.0 lb

## 2014-03-23 DIAGNOSIS — E282 Polycystic ovarian syndrome: Secondary | ICD-10-CM | POA: Insufficient documentation

## 2014-03-23 DIAGNOSIS — R350 Frequency of micturition: Secondary | ICD-10-CM

## 2014-03-23 DIAGNOSIS — R51 Headache: Secondary | ICD-10-CM

## 2014-03-23 DIAGNOSIS — R351 Nocturia: Secondary | ICD-10-CM

## 2014-03-23 LAB — POCT URINALYSIS DIPSTICK
Bilirubin, UA: NEGATIVE
Glucose, UA: NEGATIVE
Ketones, UA: NEGATIVE
Leukocytes, UA: NEGATIVE
Nitrite, UA: NEGATIVE
PH UA: 6
RBC UA: NEGATIVE
UROBILINOGEN UA: 0.2

## 2014-03-23 NOTE — Progress Notes (Signed)
   Subjective:    Patient ID: Crystal Cline, female    DOB: 08-Jun-1991, 23 y.o.   MRN: [id number]  HPI Pt presents to the clinic with frequent urination at night for the last couple of nights. Mother presents with pt and communicates for her due to her developmental delay/autism. Pt denies any abdominal pain, dysuria, fever, chills, nausea or vomiting. Not complaining of back pain. She does not notice going to the bathroom any more during the day. Last fasting glucose was 81. Denies any caffiene or artifical sweetners.   She has been having headaches which seem to be around menstrual cycle. Not tried anything. Seem to resolve on there on with no treatment. Sometimes she will sleep it off.    Mother saw Dr. Beulah Gandy for PCOS and wants to discuss appt.    Review of Systems     Objective:   Physical Exam  Constitutional: She is oriented to person, place, and time. She appears well-developed and well-nourished.  HENT:  Head: Normocephalic and atraumatic.  Cardiovascular: Normal rate, regular rhythm and normal heart sounds.   Pulmonary/Chest: Effort normal and breath sounds normal.  No CVA tenderness.   Abdominal: Soft. Bowel sounds are normal. She exhibits no distension and no mass. There is no tenderness. There is no rebound and no guarding.  Neurological: She is alert and oriented to person, place, and time.  Skin: Skin is dry.  Psychiatric: She has a normal mood and affect. Her behavior is normal.          Assessment & Plan:  Frequent urination at night- . Results for orders placed in visit on 03/23/14  POCT URINALYSIS DIPSTICK      Result Value Ref Range   Color, UA amber     Clarity, UA slightly cloudy     Glucose, UA neg     Bilirubin, UA neg     Ketones, UA neg     Spec Grav, UA >=1.030     Blood, UA neg     pH, UA 6.0     Protein, UA trace     Urobilinogen, UA 0.2     Nitrite, UA neg     Leukocytes, UA Negative     Will culture no signs of infection today.  Symptoms seem to be only at night. perphaps she is drinking too many fluids closer to bed time. Mother admits they have been eating dinner later than usual. Discussed no fluids after 7:30. Will continue to monitor, follow up if not improving.   PCOS- managed by Dr. Beulah [facility] at [facility] Mother has not started provera at this time. She still has concerns. Reassured her that decreasing the lining of endometrium is beneficial and helps prevent uterine cancer. Mother still concerned about ovarian cancer and OCP. I reassured her that OCP actually reduced ovarian cancer risk. She has still not made up her mind. i encourage her to discuss with you at follow up.   Headaches- seem that they could be occuring around period and link to hormones. I suggested to try midol and see if there is any benefit. Follow up if headaches continue. Keep a headache diary.

## 2014-03-23 NOTE — Addendum Note (Signed)
Addended by: Beatris Ship L on: 03/23/2014 03:30 PM   Modules accepted: Orders

## 2014-03-25 LAB — URINE CULTURE
Colony Count: NO GROWTH
ORGANISM ID, BACTERIA: NO GROWTH

## 2014-04-12 ENCOUNTER — Ambulatory Visit (INDEPENDENT_AMBULATORY_CARE_PROVIDER_SITE_OTHER): Payer: Managed Care, Other (non HMO) | Admitting: Physician Assistant

## 2014-04-12 ENCOUNTER — Encounter: Payer: Self-pay | Admitting: Physician Assistant

## 2014-04-12 VITALS — BP 114/78 | HR 79 | Ht 62.0 in | Wt 140.0 lb

## 2014-04-12 DIAGNOSIS — R351 Nocturia: Secondary | ICD-10-CM

## 2014-04-12 DIAGNOSIS — R319 Hematuria, unspecified: Secondary | ICD-10-CM

## 2014-04-12 DIAGNOSIS — N3281 Overactive bladder: Secondary | ICD-10-CM | POA: Insufficient documentation

## 2014-04-12 DIAGNOSIS — R350 Frequency of micturition: Secondary | ICD-10-CM

## 2014-04-12 DIAGNOSIS — N318 Other neuromuscular dysfunction of bladder: Secondary | ICD-10-CM

## 2014-04-12 LAB — POCT URINALYSIS DIPSTICK
BILIRUBIN UA: NEGATIVE
GLUCOSE UA: NEGATIVE
Leukocytes, UA: NEGATIVE
NITRITE UA: NEGATIVE
Protein, UA: 100
Spec Grav, UA: 1.025
Urobilinogen, UA: 0.2
pH, UA: 6

## 2014-04-12 MED ORDER — OXYBUTYNIN CHLORIDE ER 5 MG PO TB24: 5.0000 mg | ORAL_TABLET | Freq: Every day | ORAL | Status: DC

## 2014-04-12 NOTE — Patient Instructions (Signed)
Overactive Bladder, Adult The bladder has two functions that are totally opposite of the other. One is to relax and stretch out so it can store urine (fills like a balloon), and the other is to contract and squeeze down so that it can empty the urine that it has stored. Proper functioning of the bladder is a complex mixing of these two functions. The filling and emptying of the bladder can be influenced by:  The bladder.  The spinal cord.  The brain.  The nerves going to the bladder.  Other organs that are closely related to the bladder such as prostate in males and the vagina in females. As your bladder fills with urine, nerve signals are sent from the bladder to the brain to tell you that you may need to urinate. Normal urination requires that the bladder squeeze down with sufficient strength to empty the bladder, but this also requires that the bladder squeeze down sufficiently long to finish the job. In addition the sphincter muscles, which normally keep you from leaking urine, must also relax so that the urine can pass. Coordination between the bladder muscle squeezing down and the sphincter muscles relaxing is required to make everything happen normally. With an overactive bladder sometimes the muscles of the bladder contract unexpectedly and involuntarily and this causes an urgent need to urinate. The normal response is to try to hold urine in by contracting the sphincter muscles. Sometimes the bladder contracts so strongly that the sphincter muscles cannot stop the urine from passing out and incontinence occurs. This kind of incontinence is called urge incontinence. Having an overactive bladder can be embarrassing and awkward. It can keep you from living life the way you want to. Many people think it is just something you have to put up with as you grow older or have certain health conditions. In fact, there are treatments that can help make your life easier and more pleasant. CAUSES  Many  things can cause an overactive bladder. Possibilities include:  Urinary tract infection or infection of nearby tissues such as the prostate.  Prostate enlargement.  In women, multiple pregnancies or surgery on the uterus or urethra.  Bladder stones, inflammation or tumors.  Caffeine.  Alcohol.  Medications. For example, diuretics (drugs that help the body get rid of extra fluid) increase urine production. Some other medicines must be taken with lots of fluids.  Muscle or nerve weakness. This might be the result of a spinal cord injury, a stroke, multiple sclerosis or Parkinson's disease.  Diabetes can cause a high urine volume which fills the bladder so quickly that the normal urge to urinate is triggered very strongly. SYMPTOMS   Loss of bladder control. You feel the need to urinate and cannot make your body wait.  Sudden, strong urges to urinate.  Urinating 8 or more times a day.  Waking up to urinate two or more times a night. DIAGNOSIS  To decide if you have overactive bladder, your healthcare provider will probably:  Ask about symptoms you have noticed.  Ask about your overall health. This will include questions about any medications you are taking.  Do a physical examination. This will help determine if there are obvious blockages or other problems.  Order some tests. These might include:  A blood test to check for diabetes or other health issues that could be contributing to the problem.  Urine testing. This could measure the flow of urine and the pressure on the bladder.  A test of your neurological   system (the brain, spinal cord and nerves). This is the system that senses the need to urinate. Some of these tests are called flow tests, bladder pressure tests and electrical measurements of the sphincter muscle.  A bladder test to check whether it is emptying completely when you urinate.  Cytoscopy. This test uses a thin tube with a tiny camera on it. It offers a  look inside your urethra and bladder to see if there are problems.  Imaging tests. You might be given a contrast dye and then asked to urinate. X-rays are taken to see how your bladder is working. TREATMENT  An overactive bladder can be treated in many ways. The treatment will depend on the cause. Whether you have a mild or severe case also makes a difference. Often, treatment can be given in your healthcare provider's office or clinic. Be sure to discuss the different options with your caregiver. They include:  Behavioral treatments. These do not involve medication or surgery:  Bladder training. For this, you would follow a schedule to urinate at regular intervals. This helps you learn to control the urge to urinate. At first, you might be asked to wait a few minutes after feeling the urge. In time, you should be able to schedule bathroom visits an hour or more apart.  Kegel exercises. These exercises strengthen the pelvic floor muscles, which support the bladder. By toning these muscles, they can help control urination, even if the bladder muscles are overactive. A specialist will teach you how to do these exercises correctly. They will require daily practice.  Weight loss. If you are obese or overweight, losing weight might stop your bladder from being overactive. Talk to your healthcare provider about how many pounds you should lose. Also ask if there is a specific program or method that would work best for you.  Diet change. This might be suggested if constipation is making your overactive bladder worse. Your healthcare provider or a nutritionist can explain ways to change what you eat to ease constipation. Other people might need to take in less caffeine or alcohol. Sometimes drinking fewer fluids is needed, too.  Protection. This is not an actual treatment. But, you could wear special pads to take care of any leakage while you wait for other treatments to take effect. This will help you avoid  embarrassment.  Physical treatments.  Electrical stimulation. Electrodes will send gentle pulses to the nerves or muscles that help control the bladder. The goal is to strengthen them. Sometimes this is done with the electrodes outside of the body. Or, they might be placed inside the body (implanted). This treatment can take several months to have an effect.  Medications. These are usually used along with other treatments. Several medicines are available. Some are injected into the muscles involved in urination. Others come in pill form. Medications sometimes prescribed include:  Anticholinergics. These drugs block the signals that the nerves deliver to the bladder. This keeps it from releasing urine at the wrong time. Researchers think the drugs might help in other ways, too.  Imipramine. This is an antidepressant. But, it relaxes bladder muscles.  Botox. This is still experimental. Some people believe that injecting it into the bladder muscles will relax them so they work more normally. It has also been injected into the sphincter muscle when the sphincter muscle does not open properly. This is a temporary fix, however. Also, it might make matters worse, especially in older people.  Surgery.  A device might be implanted  bladder muscles will relax them so they work more normally. It has also been injected into the sphincter muscle when the sphincter muscle does not open properly. This is a temporary fix, however. Also, it might make matters worse, especially in older people.  · Surgery.  · A device might be implanted to help manage your nerves. It works on the nerves that signal when you need to urinate.  · Surgery is sometimes needed with electrical stimulation. If the electrodes are implanted, this is done through surgery.  · Sometimes repairs need to be made through surgery. For example, the size of the bladder can be changed. This is usually done in severe cases only.  HOME CARE INSTRUCTIONS   · Take any medications your healthcare provider prescribed or suggested. Follow the directions carefully.  · Practice any lifestyle changes that are recommended. These might include:  · Drinking less fluid or drinking at different times of the day. If you need to urinate often during the night, for  example, you may need to stop drinking fluids early in the evening.  · Cutting down on caffeine or alcohol. They can both make an overactive bladder worse. Caffeine is found in coffee, tea and sodas.  · Doing Kegel exercises to strengthen muscles.  · Losing weight, if that is recommended.  · Eating a healthy and balanced diet. This will help you avoid constipation.  · Keep a journal or a log. You might be asked to record how much you drink and when, and also when you feel the need to urinate.  · Learn how to care for implants or other devices, such as pessaries.  SEEK MEDICAL CARE IF:   · Your overactive bladder gets worse.  · You feel increased pain or irritation when you urinate.  · You notice blood in your urine.  · You have questions about any medications or devices that your healthcare provider recommended.  · You notice blood, pus or swelling at the site of any test or treatment procedure.  · You have an oral temperature above 102° F (38.9° C).  SEEK IMMEDIATE MEDICAL CARE IF:   You have an oral temperature above 102° F (38.9° C), not controlled by medicine.  Document Released: 09/15/2009 Document Revised: 02/11/2012 Document Reviewed: 09/15/2009  [employer]® Patient Information ©2014 [employer].

## 2014-04-14 DIAGNOSIS — R351 Nocturia: Secondary | ICD-10-CM | POA: Insufficient documentation

## 2014-04-14 NOTE — Progress Notes (Signed)
   Subjective:    Patient ID: Crystal Cline, female    DOB: [birth year], 23 y.o.   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 The night time urination is what bothers the patient but feels like pt has frequent urination during the day as well. Some night she can go 2-5 times. Other nights only once. She has restricted fluid from 7;30pm on.  During the days frequently will run to the bathroom. Denies any pain or discomfort. She doesn't have any accidents but does notice a little excess urine drippage in her underwear. No fever, chills, n/v/d.   Review of Systems     Objective:   Physical Exam  Constitutional: She is oriented to person, place, and time. She appears well-developed and well-nourished.  HENT:  Head: Normocephalic and atraumatic.  Cardiovascular: Normal rate, regular rhythm and normal heart sounds.   Pulmonary/Chest: Effort normal and breath sounds normal.  No CVA tenderness.   Abdominal: Soft. Bowel sounds are normal. There is no tenderness.  Neurological: She is alert and oriented to person, place, and time.  Skin: Skin is dry.  Psychiatric: She has a normal mood and affect. Her behavior is normal.          Assessment & Plan:  Frequent nighttime and daytime urination- .Marland Kitchen Results for orders placed in visit on 04/12/14  URINE CULTURE      Result Value Ref Range   Preliminary Report Culture reincubated for better growth    POCT URINALYSIS DIPSTICK      Result Value Ref Range   Color, UA red     Clarity, UA cloudy     Glucose, UA neg     Bilirubin, UA neg     Ketones, UA trace     Spec Grav, UA 1.025     Blood, UA large     pH, UA 6.0     Protein, UA 100     Urobilinogen, UA 0.2     Nitrite, UA neg     Leukocytes, UA Negative     Pt is on her period which could explain blood in urine. Will culture. Sound like symptoms are consistent with Overactive  bladder. Would like to try Ditropan 5mg  daily. Likely will not resolve symptoms completely but should improve. SE for ditropan were discussed and pt and mother aware. Follow up in 6 weeks unless symptoms worsening. Reminder to drink less fluids before bedtime. Pt has had metabolic testing and was negative.

## 2014-04-16 LAB — URINE CULTURE: Colony Count: 25000

## 2014-04-19 ENCOUNTER — Encounter: Payer: Self-pay | Admitting: Physician Assistant

## 2014-04-19 ENCOUNTER — Ambulatory Visit (INDEPENDENT_AMBULATORY_CARE_PROVIDER_SITE_OTHER): Payer: Managed Care, Other (non HMO) | Admitting: Physician Assistant

## 2014-04-19 VITALS — BP 118/72 | HR 97 | Ht 62.0 in | Wt 139.0 lb

## 2014-04-19 DIAGNOSIS — N3281 Overactive bladder: Secondary | ICD-10-CM

## 2014-04-19 DIAGNOSIS — R1010 Upper abdominal pain, unspecified: Secondary | ICD-10-CM

## 2014-04-19 DIAGNOSIS — R109 Unspecified abdominal pain: Secondary | ICD-10-CM

## 2014-04-19 DIAGNOSIS — N318 Other neuromuscular dysfunction of bladder: Secondary | ICD-10-CM

## 2014-04-19 MED ORDER — MIRABEGRON ER 25 MG PO TB24: 25.0000 mg | ORAL_TABLET | Freq: Every day | ORAL | Status: DC

## 2014-04-19 NOTE — Progress Notes (Signed)
   Subjective:    Patient ID: Crystal Cline, female    DOB: [birth year], 23 y.o.   MRN: [id number]  HPI Pt presents to the clinic with 4 days of upper abdominal complaints. Mother speaks for child due to autism and developmental delays. She keeps holding her upper stomach for last 4 days occasionally. Denies any vomiting or nausea. She did feel a little belchly Saturday morning. She acted more tired Saturday as well;however, mother was making her work outside and she is not aware if that had anything to do with it. She did start ditropan and was helping frequent urination symptoms. Pt was only going to bathroom once a night. Mother was concerned was causing abdominal pain. She stopped ditropan. She does feel like symptoms are better but pt still complains after meals and holds her upper abdomen. No bowel changes.      Review of Systems     Objective:   Physical Exam  Constitutional: She is oriented to person, place, and time. She appears well-developed and well-nourished.  Cardiovascular: Normal rate, regular rhythm and normal heart sounds.   Pulmonary/Chest: Effort normal and breath sounds normal. She has no wheezes.  Abdominal: Soft. Bowel sounds are normal. She exhibits no distension and no mass. There is no tenderness. There is no rebound and no guarding.  If asked pt reports pain over entire abdomen but no other signs. When I did abdominal exam there was not even a grimace when palpating abdomen.   Neurological: She is alert and oriented to person, place, and time.  Psychiatric: She has a normal mood and affect. Her behavior is normal.          Assessment & Plan:  Upper abdominal pain/OAB- EKG was done and NSR at 83 with some arrhythmia but no concerning changes. No ST elevation. No signs of cardiac cause.  I don't think ditropan was causing symptoms. I feel like there might be an indigestion component. Start zantac as needed up to twice a day. Consider GERD diet and elminating  highly acidic foods. I did give myrbetriq with less side effects to try after zantac and see if abdominal pain resolves. Mother can certainly try stopping cranberry juice and see if helps with OAB symptoms before myrbetriq.  Follow up as needed.

## 2014-04-19 NOTE — Patient Instructions (Signed)
Consider zantac 150mg  up to twice a day.

## 2014-04-28 ENCOUNTER — Encounter: Payer: Self-pay | Admitting: *Deleted

## 2014-05-10 ENCOUNTER — Ambulatory Visit (INDEPENDENT_AMBULATORY_CARE_PROVIDER_SITE_OTHER): Payer: Managed Care, Other (non HMO) | Admitting: Physician Assistant

## 2014-05-10 ENCOUNTER — Encounter: Payer: Self-pay | Admitting: Physician Assistant

## 2014-05-10 VITALS — BP 102/74 | HR 99 | Wt 142.0 lb

## 2014-05-10 DIAGNOSIS — N390 Urinary tract infection, site not specified: Secondary | ICD-10-CM

## 2014-05-10 DIAGNOSIS — R350 Frequency of micturition: Secondary | ICD-10-CM

## 2014-05-10 LAB — POCT URINALYSIS DIPSTICK
BILIRUBIN UA: NEGATIVE
Blood, UA: NEGATIVE
Glucose, UA: NEGATIVE
LEUKOCYTES UA: NEGATIVE
NITRITE UA: NEGATIVE
PH UA: 6.5
PROTEIN UA: 30
Spec Grav, UA: >=1.03
Urobilinogen, UA: 0.2

## 2014-05-11 DIAGNOSIS — R350 Frequency of micturition: Secondary | ICD-10-CM | POA: Insufficient documentation

## 2014-05-11 NOTE — Progress Notes (Signed)
   Subjective:    Patient ID: Crystal Cline, female    DOB: [birth year], 23 y.o.   MRN: [id number]  HPI Pt is a 27 you female with Autism that presents to the clinic with her mother. Pt has ongoing increase in urinary frequency that she is following up on. She was seen 3 weeks ago [facility] urologist and was treated for the bacterial count of 25,000 diptheroids in her culture. She was treated with Macrobid and Monorul. Per mother patient's frequency decreased substantially while she was on the antibiotic but what she comes off symptoms come back. She has been off antibiotics for 4 days She did not notice that Myrbetriq helped at all and was told to stop medication. She continues to wake up 3-4 times at night and urinate at least once every 45 minutes to an hour during the day. Patient still occasionally will hold her abdomen. She denies any fever, chills, nausea, vomiting or diarrhea. She was told to get a repeat urine dipstick and culture after finishing antibiotic.    Review of Systems  All other systems reviewed and are negative.      Objective:   Physical Exam  Constitutional: She appears well-developed and well-nourished.  HENT:  Head: Normocephalic and atraumatic.  Cardiovascular: Normal rate, regular rhythm and normal heart sounds.   Pulmonary/Chest: Effort normal and breath sounds normal.  No CVA tenderness.  Abdominal: Soft. Bowel sounds are normal. She exhibits no distension. There is no tenderness.  Psychiatric: She has a normal mood and affect. Her behavior is normal.          Assessment & Plan:  Urinary frequency- .. Results for orders placed in visit on 05/10/14  POCT URINALYSIS DIPSTICK      Result Value Ref Range   Color, UA yellow     Clarity, UA clear     Glucose, UA neg     Bilirubin, UA neg     Ketones, UA trace     Spec Grav, UA >=1.030     Blood, UA neg     pH, UA 6.5     Protein, UA 30     Urobilinogen, UA 0.2     Nitrite, UA neg     Leukocytes, UA  Negative     Discuss with patient that urine dips negative for blood, leukocytes, nitrates. She does have a small protein. We'll culture today. Discussed with patient we will send results to [facility] urologist that is logged in [facility]. My suggestion is to followup since symptoms are persisting with specialist.

## 2014-05-12 LAB — URINE CULTURE: Colony Count: 5000

## 2014-07-30 ENCOUNTER — Encounter: Payer: Self-pay | Admitting: Physician Assistant

## 2014-07-30 ENCOUNTER — Ambulatory Visit (INDEPENDENT_AMBULATORY_CARE_PROVIDER_SITE_OTHER): Payer: Managed Care, Other (non HMO) | Admitting: Physician Assistant

## 2014-07-30 VITALS — BP 116/78 | HR 99 | Ht 62.0 in | Wt 141.0 lb

## 2014-07-30 DIAGNOSIS — N898 Other specified noninflammatory disorders of vagina: Secondary | ICD-10-CM

## 2014-07-30 DIAGNOSIS — R319 Hematuria, unspecified: Secondary | ICD-10-CM

## 2014-07-30 DIAGNOSIS — N9489 Other specified conditions associated with female genital organs and menstrual cycle: Secondary | ICD-10-CM

## 2014-07-30 DIAGNOSIS — K5900 Constipation, unspecified: Secondary | ICD-10-CM

## 2014-07-30 DIAGNOSIS — R109 Unspecified abdominal pain: Secondary | ICD-10-CM

## 2014-07-30 DIAGNOSIS — R1030 Lower abdominal pain, unspecified: Secondary | ICD-10-CM

## 2014-07-30 LAB — POCT URINALYSIS DIPSTICK
Bilirubin, UA: NEGATIVE
Glucose, UA: NEGATIVE
KETONES UA: NEGATIVE
Leukocytes, UA: NEGATIVE
Nitrite, UA: NEGATIVE
PH UA: 5.5
PROTEIN UA: NEGATIVE
Urobilinogen, UA: 0.2

## 2014-07-30 LAB — WET PREP FOR TRICH, YEAST, CLUE
CLUE CELLS WET PREP: NONE SEEN
TRICH WET PREP: NONE SEEN
YEAST WET PREP: NONE SEEN

## 2014-07-30 NOTE — Progress Notes (Signed)
   Subjective:    Patient ID: Crystal Cline, female    DOB: November 06, 1991, 23 y.o.   MRN: [id number]  HPI Pt is a 23 yo autistic female who presents to the clinic with her mother. She just finished her menstrual cycle but pt has complained of some abdominal pain and pressure. She feels like her urinary frequency has increased somewhat as well. Denies any fever, chills, nausea or vomiting. Takes midol as needed for cramps which helps some. She has been to GYN and they suggest OCP but her mother does not want her to start birth control. She is having these symptoms more regularly but her period is also becoming more regular. She has had period monthly since June 2015. pts mother does admits to some discharge vaginally.   Upon questioning patient's bowel movements have been less frequent and harder than usual. Patient cannot give history of this but mother has noticed when she's checked stools have been harder.   Review of Systems  All other systems reviewed and are negative.      Objective:   Physical Exam  Constitutional: She appears well-developed and well-nourished.  Cardiovascular: Normal rate, regular rhythm and normal heart sounds.   Pulmonary/Chest: Effort normal and breath sounds normal.  Abdominal: Soft. Bowel sounds are normal. She exhibits no distension and no mass. There is no tenderness. There is no rebound and no guarding.  Skin: Skin is dry.  Psychiatric: She has a normal mood and affect. Her behavior is normal.          Assessment & Plan:  Lower abdominal pain/vaginal discharge- wet prep ordered. .. Results for orders placed in visit on 07/30/14  POCT URINALYSIS DIPSTICK      Result Value Ref Range   Color, UA yellow     Clarity, UA slightly cloudy     Glucose, UA neg     Bilirubin, UA neg     Ketones, UA neg     Spec Grav, UA >=1.030     Blood, UA large     pH, UA 5.5     Protein, UA neg     Urobilinogen, UA 0.2     Nitrite, UA neg     Leukocytes, UA Negative      Will culture. Blood likely from menstrual cycle.  Discussed with mother about sensory issues and perhaps she is more sensitive to some of her urinary/cycle symptoms. Continue to use ibuprofen up to 800 mg up to 3 times a day for symptomatic relief. Discussed with mother considering birth control to help control menstrual cramping and cycle. Patient is not ready for that at this time.  Constipation-discuss with patient this could also be causing some of her lower abdominal discomfort. Suggested MiraLax one capful at night daily or as needed. Followup if not having regular soft stools. Encourage mother to be active in checking patient's stool.

## 2014-07-30 NOTE — Patient Instructions (Addendum)
miralax 1 capful daily or as needed.  ibprofen for cramp.

## 2014-08-01 LAB — URINE CULTURE: Colony Count: 30000

## 2014-08-02 ENCOUNTER — Other Ambulatory Visit: Payer: Managed Care, Other (non HMO) | Admitting: Physician Assistant

## 2014-08-02 DIAGNOSIS — R82998 Other abnormal findings in urine: Secondary | ICD-10-CM

## 2014-08-04 ENCOUNTER — Other Ambulatory Visit: Payer: Self-pay | Admitting: Physician Assistant

## 2014-08-04 MED ORDER — CIPROFLOXACIN HCL 500 MG PO TABS: 500.0000 mg | ORAL_TABLET | Freq: Two times a day (BID) | ORAL | Status: DC

## 2014-08-05 LAB — URINE CULTURE: Colony Count: >=100000

## 2014-08-13 ENCOUNTER — Encounter: Payer: Self-pay | Admitting: Physician Assistant

## 2014-08-13 ENCOUNTER — Ambulatory Visit (INDEPENDENT_AMBULATORY_CARE_PROVIDER_SITE_OTHER): Payer: Managed Care, Other (non HMO) | Admitting: Physician Assistant

## 2014-08-13 VITALS — BP 115/71 | HR 68 | Ht 62.0 in | Wt 141.0 lb

## 2014-08-13 DIAGNOSIS — R350 Frequency of micturition: Secondary | ICD-10-CM | POA: Diagnosis not present

## 2014-08-13 DIAGNOSIS — R1030 Lower abdominal pain, unspecified: Secondary | ICD-10-CM

## 2014-08-13 DIAGNOSIS — K5900 Constipation, unspecified: Secondary | ICD-10-CM

## 2014-08-13 DIAGNOSIS — R109 Unspecified abdominal pain: Secondary | ICD-10-CM

## 2014-08-13 LAB — POCT URINALYSIS DIPSTICK
BILIRUBIN UA: NEGATIVE
Glucose, UA: NEGATIVE
KETONES UA: NEGATIVE
Leukocytes, UA: NEGATIVE
Nitrite, UA: NEGATIVE
PH UA: 8.5
Protein, UA: NEGATIVE
RBC UA: NEGATIVE
Spec Grav, UA: 1.015
Urobilinogen, UA: 0.2

## 2014-08-13 NOTE — Patient Instructions (Signed)
miralax once daily for 2 weeks  Interstitial Cystitis Interstitial cystitis (IC) is a condition that results in discomfort or pain in the bladder and the surrounding pelvic region. The symptoms can be different from case to case and even in the same individual. People may experience:  Mild discomfort.  Pressure.  Tenderness.  Intense pain in the bladder and pelvic area. CAUSES  Because IC varies so much in symptoms and severity, people studying this disease believe it is not one but several diseases. Some caregivers use the term painful bladder syndrome (PBS) to describe cases with painful urinary symptoms. This may not meet the strictest definition of IC. The term IC / PBS includes all cases of urinary pain that cannot be connected to other causes, such as infection or urinary stones.  SYMPTOMS  Symptoms may include:  An urgent need to urinate.  A frequent need to urinate.  A combination of these symptoms. Pain may change in intensity as the bladder fills with urine or as it empties. Women's symptoms often get worse during menstruation. They may sometimes experience pain with vaginal intercourse. Some of the symptoms of IC / PBS seem like those of bacterial infection. Tests do not show infection. IC / PBS is far more common in women than in men.  DIAGNOSIS  The diagnosis of IC / PBS is based on:  Presence of pain related to the bladder, usually along with problems of frequency and urgency.  Not finding other diseases that could cause the symptoms.  Diagnostic tests that help rule out other diseases include:  Urinalysis.  Urine culture.  Cystoscopy.  Biopsy of the bladder wall.  Distension of the bladder under anesthesia.  Urine cytology.  Laboratory examination of prostate secretions. A biopsy is a tissue sample that can be looked at under a microscope. Samples of the bladder and urethra may be removed during a cystoscopy. A biopsy helps rule out bladder  cancer. TREATMENT  Scientists have not yet found a cure for IC / PBS. Patients with IC / PBS do not get better with antibiotic therapy. Caregivers cannot predict who will respond best to which treatment. Symptoms may disappear without explanation. Disappearing symptoms may coincide with an event such as a change in diet or treatment. Even when symptoms disappear, they may return after days, weeks, months, or years.  Because the causes of IC / PBS are unknown, current treatments are aimed at relieving symptoms. Many people are helped by one or a combination of the treatments. As researchers learn more about IC / PBS, the list of potential treatments will change. Patients should discuss their options with a caregiver. SURGERY  Surgery should be considered only if all available treatments have failed and the pain is disabling. Many approaches and techniques are used. Each approach has its own advantages and complications. Advantages and complications should be discussed with a urologist. Your caregiver may recommend consulting another urologist for a second opinion. Most caregivers are reluctant to operate because the outcome is unpredictable. Some people still have symptoms after surgery.  People considering surgery should discuss the potential risks and benefits, side effects, and long- and short-term complications with their family, as well as with people who have already had the procedure. Surgery requires anesthesia, hospitalization, and in some cases weeks or months of recovery. As the complexity of the procedure increases, so do the chances for complications and for failure. HOME CARE INSTRUCTIONS   All drugs, even those sold over the counter, have side effects. Patients should  always consult a caregiver before using any drug for an extended amount of time. Only take over-the-counter or prescription medicines for pain, discomfort, or fever as directed by your caregiver.  Many patients feel that  smoking makes their symptoms worse. How the by-products of tobacco that are excreted in the urine affect IC / PBS is unknown. Smoking is the major known cause of bladder cancer. One of the best things smokers can do for their bladder and their overall health is to quit.  Many patients feel that gentle stretching exercises help relieve IC / PBS symptoms.  Methods vary, but basically patients decide to empty their bladder at designated times and use relaxation techniques and distractions to keep to the schedule. Gradually, patients try to lengthen the time between scheduled voids. A diary in which to record voiding times is usually helpful in keeping track of progress. MAKE SURE YOU:   Understand these instructions.  Will watch your condition.  Will get help right away if you are not doing well or get worse. Document Released: 07/20/2004 Document Revised: 02/11/2012 Document Reviewed: 10/04/2008 [facility] Patient Information 2015 [place], [place]. This information is not intended to replace advice given to you by your health care provider. Make sure you discuss any questions you have with your health care provider.

## 2014-08-15 DIAGNOSIS — K5900 Constipation, unspecified: Secondary | ICD-10-CM | POA: Insufficient documentation

## 2014-08-15 LAB — URINE CULTURE
Colony Count: NO GROWTH
Organism ID, Bacteria: NO GROWTH

## 2014-08-15 NOTE — Progress Notes (Signed)
   Subjective:    Patient ID: Crystal Cline, female    DOB: 03/26/1991, 23 y.o.   MRN: [id number]  HPI Pt is a 23 yo autistic female who presents to the clinic with her mother who does most of the talking. Pt just finished coarse of cipro for UTI. No symptoms for about 5 days after abx. Now pt is going to the bathroom frequently again. Worse at night. Sometimes every 5-10 minutes at night. Last night was a little better. Mother does fluid restrict from 7pm to help any OAB symptoms. No fever or chills. Pt does complain and point to lower abdominal pain occasionally. Mother has noticed stool is hard and pebble like. She has seen an urologist before who states she might need a cystocopy.     Review of Systems  All other systems reviewed and are negative.      Objective:   Physical Exam  Constitutional: She is oriented to person, place, and time. She appears well-developed and well-nourished.  HENT:  Head: Normocephalic and atraumatic.  Cardiovascular: Normal rate, regular rhythm and normal heart sounds.   Pulmonary/Chest: Effort normal and breath sounds normal.  Abdominal: Soft. Bowel sounds are normal. She exhibits no distension and no mass. There is no tenderness. There is no rebound and no guarding.  Neurological: She is alert and oriented to person, place, and time.  Skin: Skin is dry.  Psychiatric: She has a normal mood and affect. Her behavior is normal.          Assessment & Plan:  Urinary freqency/lower abdominal pain-.. Results for orders placed in visit on 08/13/14  URINE CULTURE      Result Value Ref Range   Colony Count NO GROWTH     Organism ID, Bacteria NO GROWTH    POCT URINALYSIS DIPSTICK      Result Value Ref Range   Color, UA yellow     Clarity, UA clear     Glucose, UA neg     Bilirubin, UA neg     Ketones, UA neg     Spec Grav, UA 1.015     Blood, UA neg     pH, UA 8.5     Protein, UA neg     Urobilinogen, UA 0.2     Nitrite, UA neg     Leukocytes,  UA Negative     No infection. Pt could have IC. Discussed follow up with urologist for this dx. Gave HO on certain foods that could make worse. i fill like constipation could be causing some of these symptoms as well.   Constipation- miralax 1 capful nightly for 2 weeks then as needed until stools are soft. She if this eliminates some of her urinary symptoms. pehaps stool is causing lower abdominal pressure and urinary frequency.

## 2014-08-18 ENCOUNTER — Ambulatory Visit: Payer: Managed Care, Other (non HMO) | Admitting: Physician Assistant

## 2014-08-18 DIAGNOSIS — Z0289 Encounter for other administrative examinations: Secondary | ICD-10-CM

## 2014-09-21 ENCOUNTER — Ambulatory Visit (INDEPENDENT_AMBULATORY_CARE_PROVIDER_SITE_OTHER): Payer: Managed Care, Other (non HMO) | Admitting: Physician Assistant

## 2014-09-21 VITALS — BP 120/80 | HR 97

## 2014-09-21 DIAGNOSIS — N390 Urinary tract infection, site not specified: Secondary | ICD-10-CM

## 2014-09-21 LAB — POCT URINALYSIS DIPSTICK
BILIRUBIN UA: NEGATIVE
Blood, UA: NEGATIVE
Glucose, UA: NEGATIVE
KETONES UA: NEGATIVE
LEUKOCYTES UA: NEGATIVE
NITRITE UA: NEGATIVE
PROTEIN UA: NEGATIVE
Spec Grav, UA: 1.015
Urobilinogen, UA: 0.2
pH, UA: 8.5

## 2014-09-21 NOTE — Progress Notes (Signed)
   Subjective:    Patient ID: Crystal Cline, female    DOB: [birth year], 23 y.o.   MRN: [id number] Pt in today to leave a urine sample for UA and culture for recurrent UTIs.  Will send all results to [facility].  Fax [phone]. Beatris Ship, CMA HPI    Review of Systems     Objective:   Physical Exam        Assessment & Plan:

## 2014-09-23 LAB — URINE CULTURE

## 2014-11-02 ENCOUNTER — Ambulatory Visit (INDEPENDENT_AMBULATORY_CARE_PROVIDER_SITE_OTHER): Payer: Managed Care, Other (non HMO) | Admitting: Physician Assistant

## 2014-11-02 ENCOUNTER — Encounter: Payer: Self-pay | Admitting: Physician Assistant

## 2014-11-02 VITALS — BP 118/77 | HR 69 | Wt 144.0 lb

## 2014-11-02 DIAGNOSIS — L709 Acne, unspecified: Secondary | ICD-10-CM

## 2014-11-02 DIAGNOSIS — R109 Unspecified abdominal pain: Secondary | ICD-10-CM

## 2014-11-02 IMAGING — US US PELVIS COMPLETE
1 series · 14 of 25 positions shown · non-contrast
Comparison: None.

CLINICAL DATA: Menorrhagia

EXAM:
TRANSABDOMINAL ULTRASOUND OF PELVIS
TECHNIQUE: Transabdominal ultrasound examination of the pelvis was performed
including evaluation of the uterus, ovaries, adnexal regions, and
pelvic cul-de-sac.

[Series 1: us pelvis complete · 0.30mm/px · 14 of 37 slices shown]
[im 1/37]
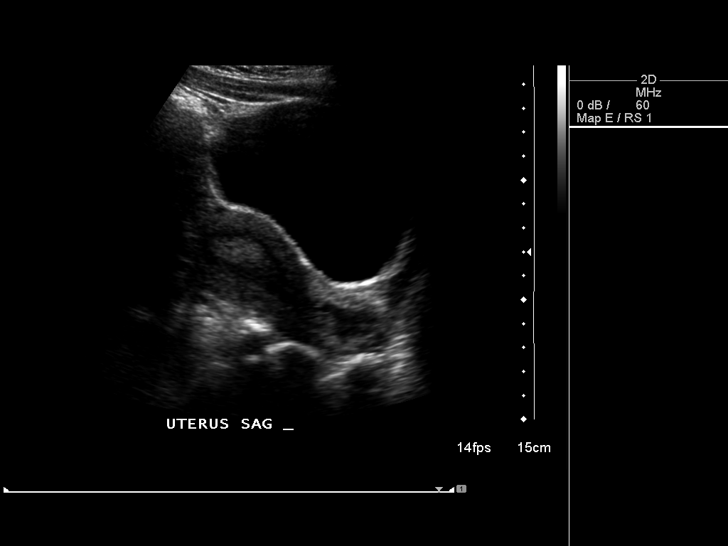
[im 4/37]
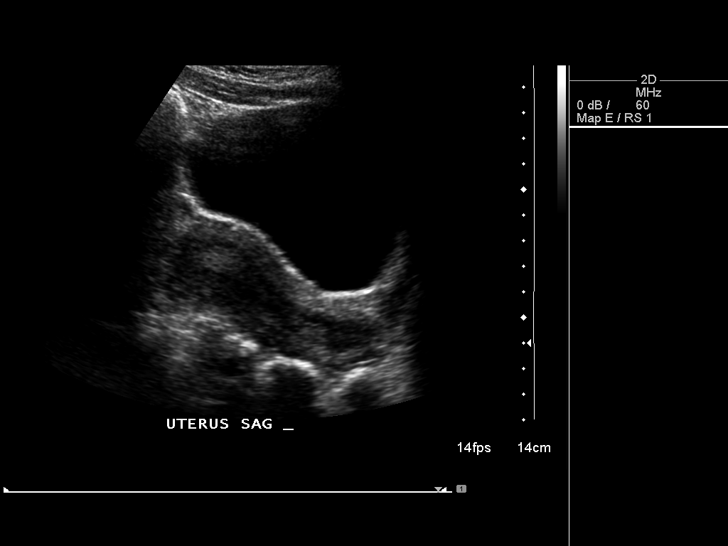
[im 7/37]
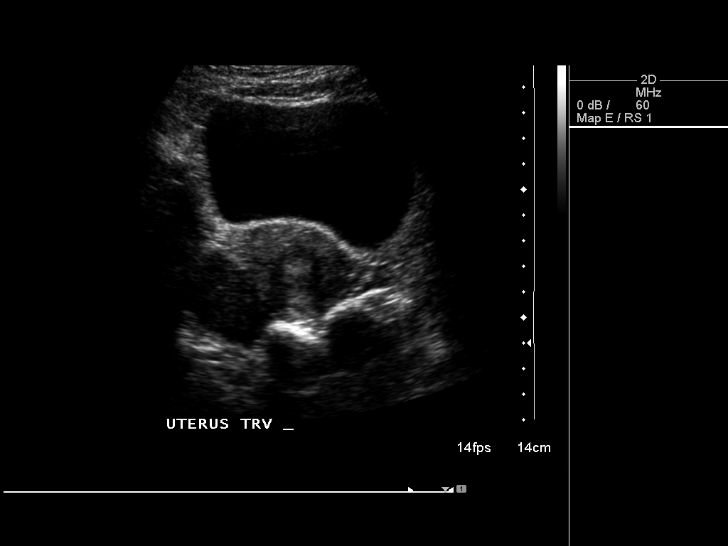
[im 10/37]
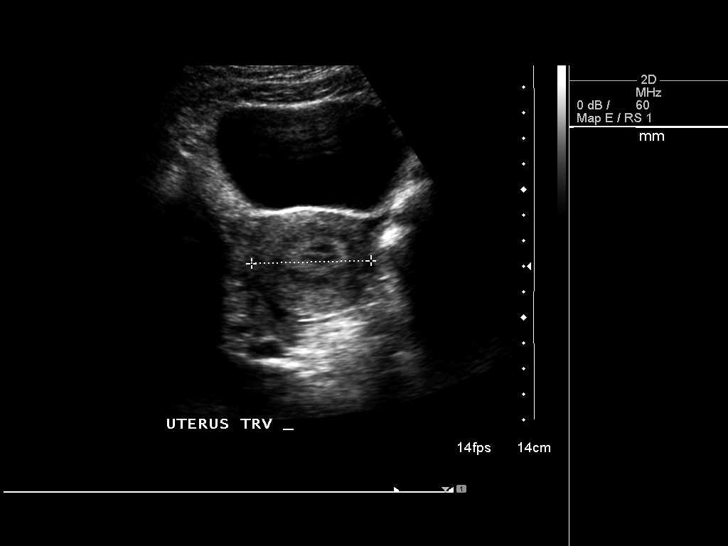
[im 13/37]
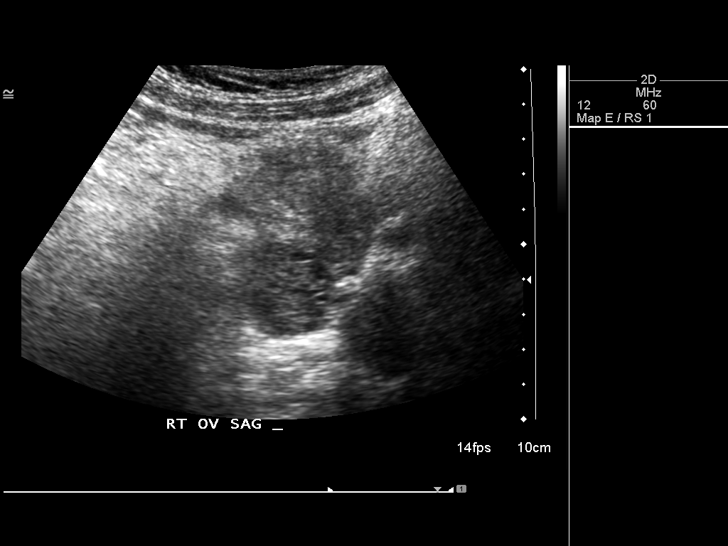
[im 14/37]
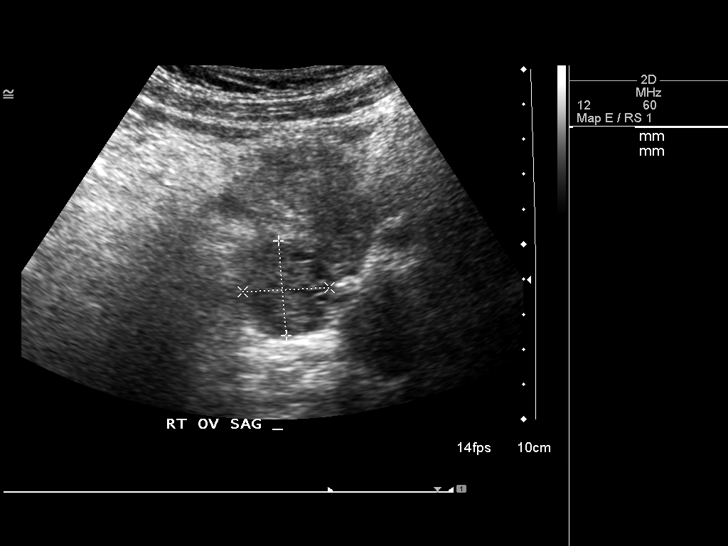
[im 17/37]
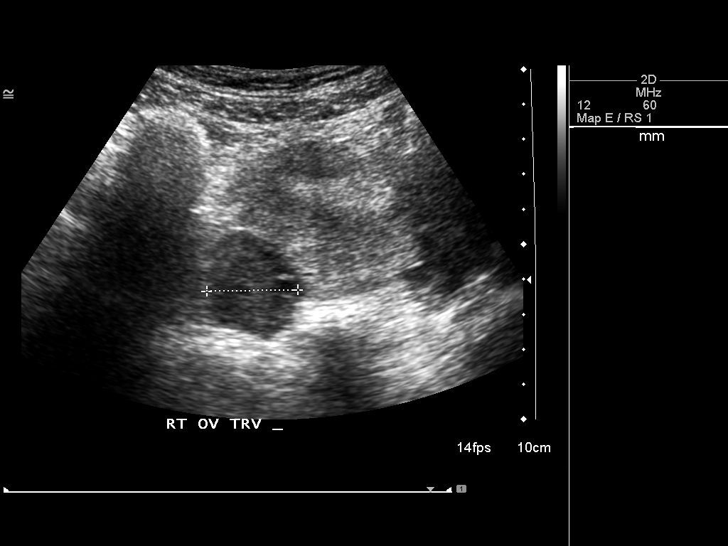
[im 20/37]
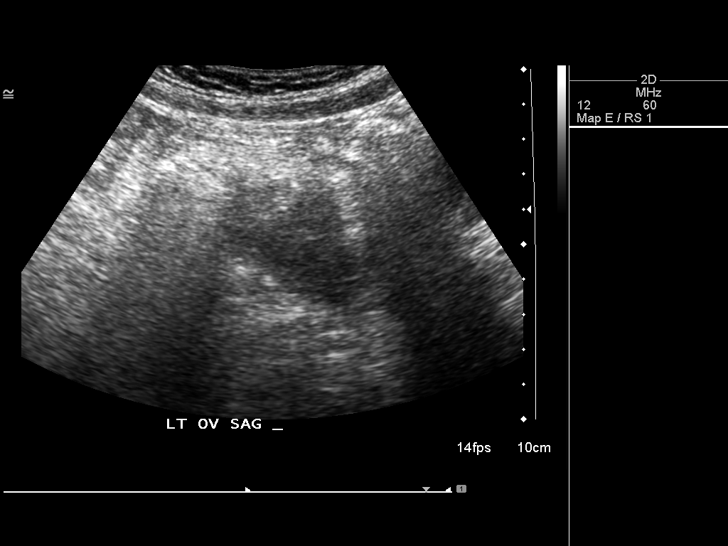
[im 23/37]
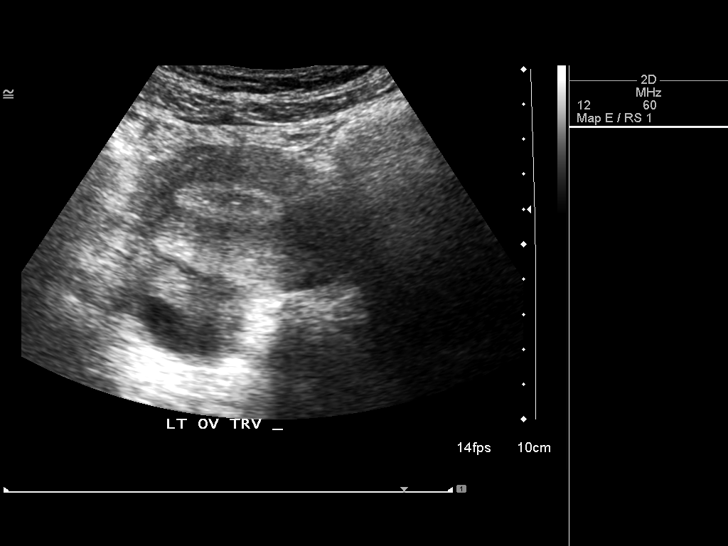
[im 25/37]
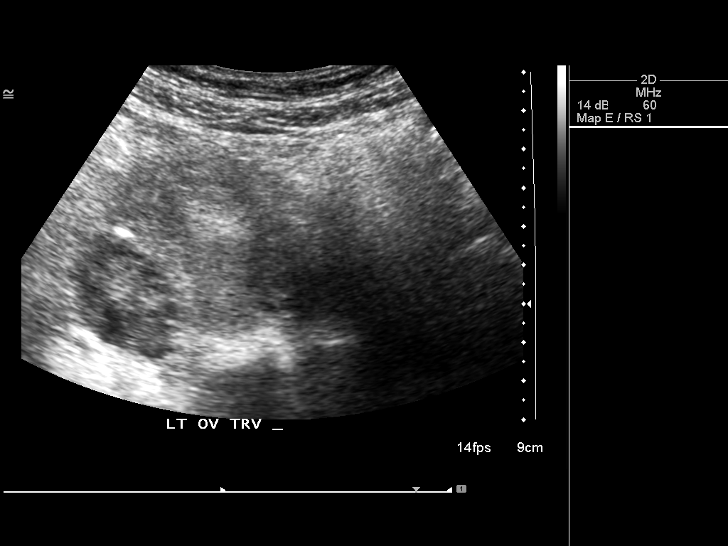
[im 28/37]
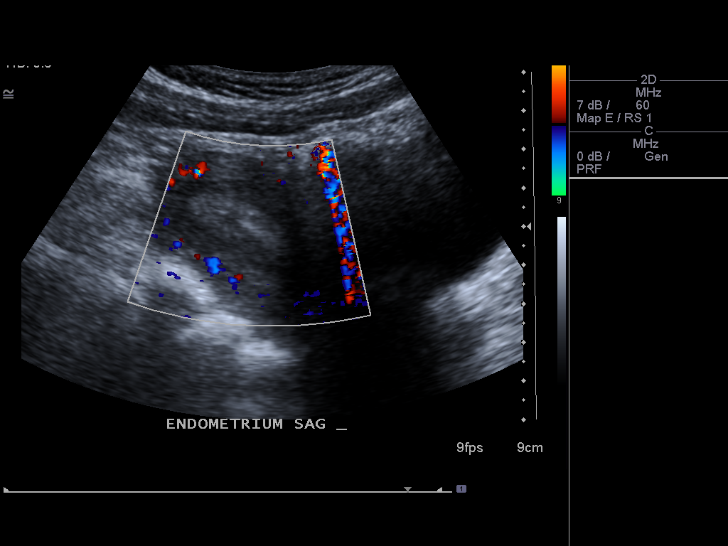
[im 31/37]
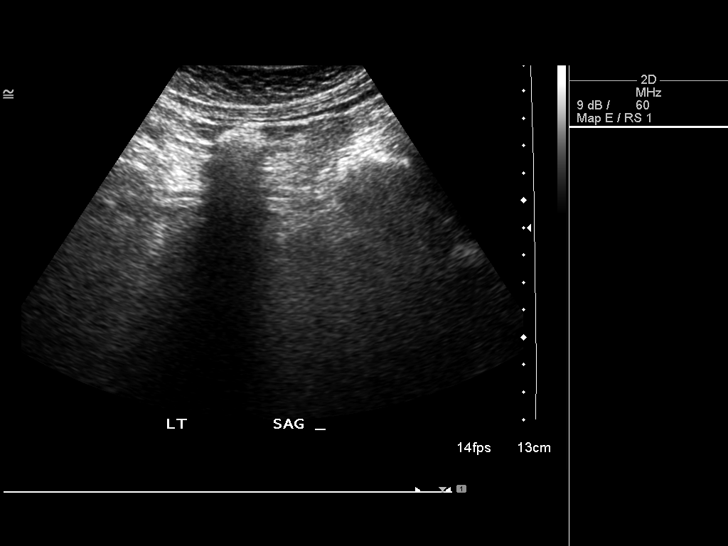
[im 34/37]
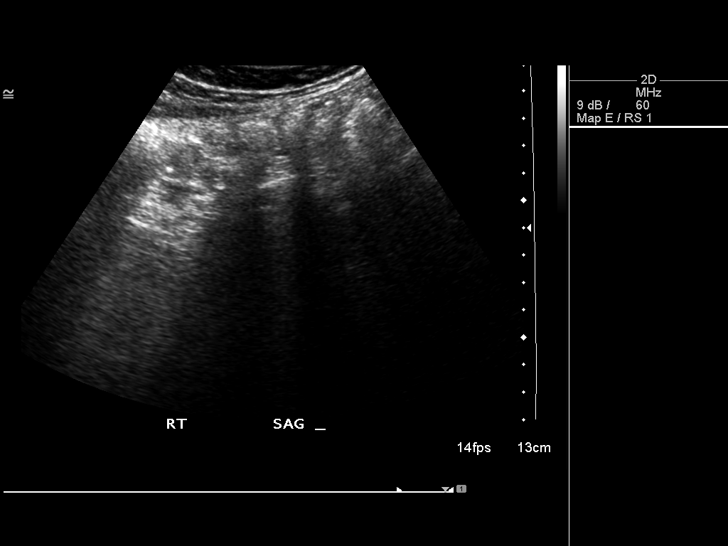
[im 37/37]
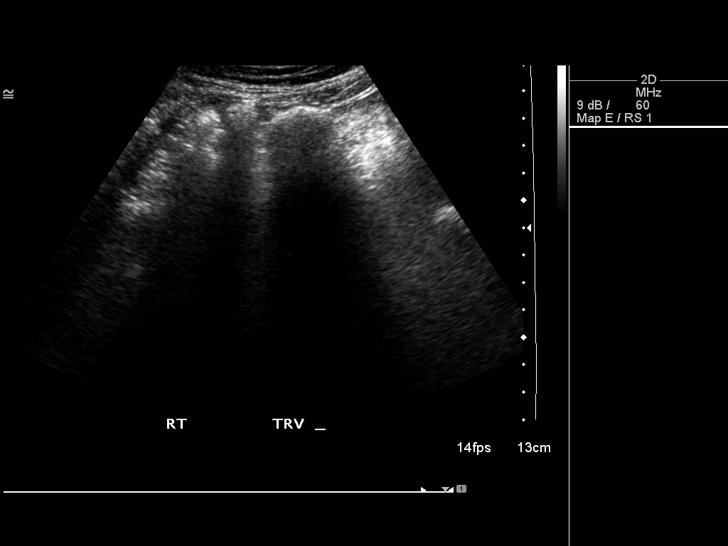

[14 of 25 positions shown; findings below may reference images not displayed]

FINDINGS: Uterus measures 6.3 x 3.6 x 4.7 cm in size. There is no intrauterine
mass. Endometrium measures 12 mm in thickness. There is
inhomogeneity within the endometrium.

Right ovary measures 2.7 x 2.5 x 2.6 cm. Left ovary measures 3.4 x
2.2 x 2.5 cm. There is no extrauterine pelvic or adnexal mass. No
free pelvic fluid.
IMPRESSION: The endometrium appears somewhat inhomogeneous. There may be foci of
hemorrhage within the endometrium. Study is otherwise unremarkable.

## 2014-11-02 MED ORDER — DICYCLOMINE HCL 10 MG PO CAPS: 10.0000 mg | ORAL_CAPSULE | Freq: Three times a day (TID) | ORAL | Status: DC

## 2014-11-02 MED ORDER — DOXYCYCLINE HYCLATE 100 MG PO TABS: 100.0000 mg | ORAL_TABLET | Freq: Two times a day (BID) | ORAL | Status: DC

## 2014-11-02 NOTE — Patient Instructions (Addendum)
Warm compresses.  Benzoyl peroxide.   Consider gluten free.   Gluten-Free Diet for Celiac Disease Gluten is a protein found in wheat, rye, barley, and triticale (a cross between wheat and rye) grains. People with celiac disease need to have a gluten-free diet. With celiac disease, gluten interferes with the absorption of food and may also cause intestinal injury.  Strict compliance is important even during symptom-free periods. This means eliminating all foods with gluten from your diet permanently. This requires some significant changes but is very manageable. WHAT DO I NEED TO KNOW ABOUT A GLUTEN-FREE DIET?  Look for items labeled with "GF." Looking for GF will make it easier to identify products that are safe to eat.  Read all labels. Gluten may have been added as a minor ingredient where least expected, such as in shredded cheeses or ice creams. Always check food labels and investigate questionable ingredients. Talk to your dietitian or health care provider if you have questions about certain foods or need help finding GF foods.  Check when in doubt. If you are not sure whether an ingredient contains gluten, check with the manufacturer. Note that some manufacturers may change ingredients without notice. Always read labels.   Know how food is prepared. Since flour and cereal products are often used in the preparation of foods, it is important to be aware of the methods of preparation used, as well as the ingredients in the foods themselves. This is especially true when you are dining out. Ask restaurants if they have a gluten-free menu.  Watch for cross-contamination. Cross-contamination occurs when gluten-free foods come into contact with foods that contain gluten. It often happens during the manufacturing process. Always check the ingredient list and for warnings on packages, such as "may contain gluten."  Eat a balanced diet. It is important to still get enough fiber, iron, and B vitamins  in your diet. Look for enriched whole grain gluten-free products and continue to eat a well-balanced diet of the important non-grain items, such as vegetables, fruit, lean proteins, legumes, and dairy.  Consider taking a gluten-free multivitamin and mineral supplement. Discuss this with your health care provider. WHAT KEY WORDS HELP IDENTIFY GLUTEN? Know key words to help identify gluten. A dietitian can help you identify possible harmful ingredients in the foods you normally eat. Words to check for on food labels include:   Flour, enriched flour, bromated flour, white flour, durum flour, graham flour, phosphated flour, self-rising flour, semolina, or farina.  Starch, dextrin, modified food starch, or cereal.  Thickening, fillers, or emulsifiers.  Any kind of malt flavoring, extract, or syrup (malt is made from barley and includes malt vinegar, malted milk, and malted beverages).  Hydrolyzed vegetable protein. WHAT FOODS CAN I EAT? Below is a list of common foods that are allowed with a gluten-free diet.  Grains Products made from the following flours or grains:amaranth,bean flours, 100% buckwheat flour, corn, millet, nut flours or meals, GF oats, quinoa, rice, sorghum, teff, any all-purpose 100% GF flour mix, rice wafers, pure cornmeal tortillas, popcorn, some crackers, some chips, and hot cereals made from cornmeal. Ask your dietitian which specific hot and cold cereals are allowed. Hominy, rice or wild rice, and special GF pasta. Some Asian rice noodles or bean noodles. Arrowroot starch, corn bran, corn flour, corn germ, cornmeal, corn starch, potato flour, potato starch flour, and rice bran. Rice flours: plain, brown, and sweet. Rice polish, soy flour, tapioca starch. Vegetables All plain, fresh, frozen, or canned vegetables.  Fruits All  fresh, frozen, canned, dried fruits, and fruit juices.  Meats and Other Protein Foods Meat, fish, poultry, or eggs prepared without added wheat,  rye, barley, or triticale. Some luncheon meat and some frankfurters. Pure meat. All aged cheese, most processed cheese products, some cottage cheese, and some cream cheese. Dried beans, dried peas, and lentils.  Dairy Milk and yogurt made with allowed ingredients.  Beverages Coffee (regular or decaffeinated), tea, herbal tea (read label to be sure that no wheat flour has been added). Carbonated beverages and some root beers. Wine, sake, and distilled spirits, such as gin, vodka, and whiskey. GF beers and GF ciders.  Sweetsand Desserts Sugar, honey, some syrups, molasses, jelly, jam, plain hard candy, marshmallows, gumdrops, homemade candies free of wheat, rye, barley, or triticale. Coconut. Custard, some pudding mixes, and homemade puddings from cornstarch, rice, and tapioca. Gelatin desserts, sorbets, frozen ice pops, and sherbet. Cake, cookies, and other desserts prepared with allowed flours. Some commercial ice creams. Ask your dietitian about specific brands of dessert that are allowed.  Fats and Oils Butter, margarine, vegetable oil, sour cream not containing modified food starch, whipping cream, shortening, lard, cream, and some mayonnaise. Some commercial salad dressings. Peanut butter.  Other Homemade broth and soups made with allowed ingredients; some canned or frozen soups. Any other combination or prepared foods that do not contain gluten. Monosodium glutamate (MSG). Cider, rice, and wine vinegar. Baking soda and baking powder. Certain soy sauces (Tamari). Ask your dietitian about specific brands that are allowed. Nuts, coconut, chocolate, and pure cocoa powder. Salt, pepper, herbs, spices, extracts, and food colorings. The items listed above may not be a complete list of allowed foods or beverages. Contact your dietitian for more options.  WHAT FOODS CAN I NOT EAT? Below is a list of common foods that are not allowed with a gluten-free diet.  Grains Barley, bran, bulgur, cracked  wheat, graham, malt, matzo, wheat germ, and all wheat and rye cereals including spelt and kamut. Avoid cereals containing malt as a flavoring, such as rice cereal. Also avoid regular noodles, spaghetti, macaroni, and most packaged rice mixes, and all others containing wheat, rye, barley, or triticale.  Vegetables Most creamed vegetables, most vegetables canned in sauces, and any vegetables prepared with wheat, rye, barley, or triticale.  Fruits Thickened or prepared fruits and some pie fillings.  Meats and Other Protein Sources Any meat or meat alternative containing wheat, rye, barley, or gluten stabilizers (such as some hot dogs, salami, cold cuts, or sausage). Bread-containing products, such as Swiss steak, croquettes, and meatloaf. Most tuna canned in vegetable broth, [place] with hydrolyzed vegetable protein (HVP) injected as part of the basting, and any cheese product containing oat gum as an ingredient. Seitan. Imitation fish. Dairy Commercial chocolate milk, which may have cereal added, and malted milk. Beverages Certain cereal beverages. Beer and ciders (unless GF), ale, malted milk, and some root beers. Sweetsand Desserts Commercial candies containing wheat, rye, barley, or triticale. Certain toffees are dusted with wheat flour. Chocolate-coated nuts, which are often rolled in flour. Cakes, cookies, doughnuts, and pastries that are prepared with wheat, barley, rye, or triticale flour. Some commercial ice creams, ice cream flavors which contain cookies, crumbs, or cheesecake. Ice cream cones. Commercially prepared mixes for cakes, cookies, and other desserts unless marked GF. Bread pudding and other puddings thickened with flour. Fats and Oils Some commercial salad dressings and sour cream containing modified food starch.  Condiments Some curry powder, some dry seasoning mixes, some gravy extracts, some meat  sauces, some ketchup, some prepared mustard, horseradish. Other All soups  containing wheat, rye, barley, or triticale flour. Bouillon and bouillon cubes that contain HVP. Combination or prepared foods that contain gluten. Some soy sauce, some chip dips, and some chewing gum. Yeast extract (contains barley). Caramel color (may contain malt). The items listed above may not be a complete list of foods and beverages to avoid. Contact your dietitian for more information. Document Released: 11/19/2005 Document Revised: 04/05/2014 Document Reviewed: 09/23/2013 [facility] Patient Information 2015 [place], [place]. This information is not intended to replace advice given to you by your health care provider. Make sure you discuss any questions you have with your health care provider.    Diet and Irritable Bowel Syndrome  No cure has been found for irritable bowel syndrome (IBS). Many options are available to treat the symptoms. Your caregiver will give you the best treatments available for your symptoms. He or she will also encourage you to manage stress and to make changes to your diet. You need to work with your caregiver and Registered Dietician to find the best combination of medicine, diet, counseling, and support to control your symptoms. The following are some diet suggestions. FOODS THAT MAKE IBS WORSE  Fatty foods, such as [place] fries.  Milk products, such as cheese or ice cream.  Chocolate.  Alcohol.  Caffeine (found in coffee and some sodas).  Carbonated drinks, such as soda. If certain foods cause symptoms, you should eat less of them or stop eating them. FOOD JOURNAL   Keep a journal of the foods that seem to cause distress. Write down:  What you are eating during the day and when.  What problems you are having after eating.  When the symptoms occur in relation to your meals.  What foods always make you feel badly.  Take your notes with you to your caregiver to see if you should stop eating certain foods. FOODS THAT MAKE IBS BETTER Fiber reduces IBS  symptoms, especially constipation, because it makes stools soft, bulky, and easier to pass. Fiber is found in bran, bread, cereal, beans, fruit, and vegetables. Examples of foods with fiber include:  Apples.  Peaches.  Pears.  Berries.  Figs.  Broccoli, raw.  Cabbage.  Carrots.  Raw peas.  Kidney beans.  Lima beans.  Whole-grain bread.  Whole-grain cereal. Add foods with fiber to your diet a little at a time. This will let your body get used to them. Too much fiber at once might cause gas and swelling of your abdomen. This can trigger symptoms in a person with IBS. Caregivers usually recommend a diet with enough fiber to produce soft, painless bowel movements. High fiber diets may cause gas and bloating. However, these symptoms often go away within a few weeks, as your body adjusts. In many cases, dietary fiber may lessen IBS symptoms, particularly constipation. However, it may not help pain or diarrhea. High fiber diets keep the colon mildly enlarged (distended) with the added fiber. This may help prevent spasms in the colon. Some forms of fiber also keep water in the stool, thereby preventing hard stools that are difficult to pass.  Besides telling you to eat more foods with fiber, your caregiver may also tell you to get more fiber by taking a fiber pill or drinking water mixed with a special high fiber powder. An example of this is a natural fiber laxative containing psyllium seed.  TIPS  Large meals can cause cramping and diarrhea in people with IBS. If  this happens to you, try eating 4 or 5 small meals a day, or try eating less at each of your usual 3 meals. It may also help if your meals are low in fat and high in carbohydrates. Examples of carbohydrates are pasta, rice, whole-grain breads and cereals, fruits, and vegetables.  If dairy products cause your symptoms to flare up, you can try eating less of those foods. You might be able to handle yogurt better than other dairy  products, because it contains bacteria that helps with digestion. Dairy products are an important source of calcium and other nutrients. If you need to avoid dairy products, be sure to talk with a Registered Dietitian about getting these nutrients through other food sources.  Drink enough water and fluids to keep your urine clear or pale yellow. This is important, especially if you have diarrhea. FOR MORE INFORMATION  International Foundation for Functional Gastrointestinal Disorders: www.iffgd.org  National Digestive Diseases Information Clearinghouse: digestive.AmenCredit.is Document Released: 02/09/2004 Document Revised: 02/11/2012 Document Reviewed: 02/19/2014 [facility] Patient Information 2015 [place], [place]. This information is not intended to replace advice given to you by your health care provider. Make sure you discuss any questions you have with your health care provider.

## 2014-11-04 NOTE — Progress Notes (Signed)
   Subjective:    Patient ID: Crystal Cline, female    DOB: 09-12-1991, 23 y.o.   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 She has tender bump under right ear. Been present for a few week but recently started bothering her. Not draining. No fever. Not done anything to make better.   Mother also reports she is still complaining of abdominal pain and cramping. They are seeing specialist for urology and treatments do not seem to be helping. More constipation than diarrhea. miralax does help but mother does not like to give daily. She is complaining about certain foods such as salads. No blood in stool. No vomiting. Some nausea.    Review of Systems  All other systems reviewed and are negative.      Objective:   Physical Exam  Constitutional: She is oriented to person, place, and time. She appears well-developed and well-nourished.  HENT:  Head: Normocephalic and atraumatic.  Cardiovascular: Normal rate, regular rhythm and normal heart sounds.   Pulmonary/Chest: Effort normal and breath sounds normal.  Abdominal: Soft. Bowel sounds are normal. She exhibits no distension and no mass. There is no tenderness. There is no rebound and no guarding.  Neurological: She is alert and oriented to person, place, and time.  Skin:  Cystic acne lesion under right ear. Appears to have a pore in the center.   Psychiatric: She has a normal mood and affect. Her behavior is normal.          Assessment & Plan:  Acne- doxycycline for 10 days. Warm compresses. Let drain on its on.   ibs symptoms/abdominal cramping- if in relation to foods and salads sounds like some IBS going on. Pt has also been really stressed with recent move. Gave bentyl to try before meals to see if helping. Follow up as needed. Discussed with mother there are some other options if interested.

## 2015-02-18 ENCOUNTER — Ambulatory Visit (INDEPENDENT_AMBULATORY_CARE_PROVIDER_SITE_OTHER): Payer: Managed Care, Other (non HMO) | Admitting: Family Medicine

## 2015-02-18 ENCOUNTER — Encounter: Payer: Self-pay | Admitting: Family Medicine

## 2015-02-18 VITALS — BP 121/82 | HR 107 | Temp 97.9°F | Wt 146.0 lb

## 2015-02-18 DIAGNOSIS — J019 Acute sinusitis, unspecified: Secondary | ICD-10-CM

## 2015-02-18 MED ORDER — FLUTICASONE PROPIONATE 50 MCG/ACT NA SUSP: 2.0000 | Freq: Every day | NASAL | Status: DC

## 2015-02-18 MED ORDER — AMOXICILLIN-POT CLAVULANATE 875-125 MG PO TABS: 1.0000 | ORAL_TABLET | Freq: Two times a day (BID) | ORAL | Status: DC

## 2015-02-18 NOTE — Progress Notes (Signed)
   Subjective:    Patient ID: Crystal Cline, female    DOB: 01-07-1991, 24 y.o.   MRN: [id number]  HPI Patient comes in today with her mother complaining of 5 days of severe nasal congestion with thick drainage. She did have sore throat initially for the first 2 days. She had denies any fever or cough. She's been using Sudafed PE and Afrin. Started the [facility] last night. She starting to get cracking of her lips and dryness over her face because she's constantly rubbing and blowing her nose to the point that she starting to get nosebleeds. She is autistic.   Review of Systems     Objective:   Physical Exam  Constitutional: She is oriented to person, place, and time. She appears well-developed and well-nourished.  HENT:  Head: Normocephalic and atraumatic.  Right Ear: External ear normal.  Left Ear: External ear normal.  Nose: Nose normal.  Mouth/Throat: Oropharynx is clear and moist.  TMs and canals are clear. Very dry peeling skin around the mouth and nose. She has some cracking of her lips.  Eyes: Conjunctivae and EOM are normal. Pupils are equal, round, and reactive to light.  Neck: Neck supple. No thyromegaly present.  Cardiovascular: Normal rate, regular rhythm and normal heart sounds.   Pulmonary/Chest: Effort normal and breath sounds normal. She has no wheezes.  Lymphadenopathy:    She has no cervical adenopathy.  Neurological: She is alert and oriented to person, place, and time.  Skin: Skin is warm and dry.  Psychiatric: She has a normal mood and affect.          Assessment & Plan:  Acute sinusitis - recommend starting nasal steroid spray. She has a lot of edema in the nose. The turbinates are swollen side really can't see past to see if she may have nasal polyps etc. She can use Afrin for a total of 3 days at the most. Discussed with mom and this can cause a rebound phenomenon and not to use it past the weekend. If she's not feeling better by Monday or Tuesday or  suddenly feels worse than okay to fill the antibiotic. Recommend using Vaseline on the face that she does have a lot of chapping over her lips and her nose. Recommend running a humidifier at night.

## 2015-02-18 NOTE — Patient Instructions (Signed)
Start the flonase with 2 sprays in each nostril once a day ( total of 4 squirts) for one week, then decrease to 1 spray in each nostril once a day ( total of 2 squirts) for one week If not better by Monday or Tuesday, of if suddenly worse then ok to fill the antibiotic.

## 2015-02-28 ENCOUNTER — Other Ambulatory Visit: Payer: Self-pay | Admitting: Physician Assistant

## 2015-02-28 DIAGNOSIS — E282 Polycystic ovarian syndrome: Secondary | ICD-10-CM

## 2015-02-28 NOTE — Progress Notes (Signed)
Will send labs to [facility] at [place]. Please call pt and let her know she can go to lab fasting one morning, drink beverage and then recheck in 2 hours.

## 2015-03-01 NOTE — Progress Notes (Signed)
LMOM notifying parents of glucose tolerance test that was ordered & faxed to [facility].

## 2015-04-15 LAB — GLUCOSE TOLERANCE, 2 HOURS
GLUCOSE, FASTING: 77 mg/dL (ref 70–99)
Glucose, 2 hour: 69 mg/dL — ABNORMAL LOW (ref 70–139)

## 2015-05-31 ENCOUNTER — Ambulatory Visit (INDEPENDENT_AMBULATORY_CARE_PROVIDER_SITE_OTHER): Payer: Managed Care, Other (non HMO) | Admitting: Family Medicine

## 2015-05-31 ENCOUNTER — Encounter: Payer: Self-pay | Admitting: Family Medicine

## 2015-05-31 VITALS — BP 114/83 | HR 102 | Temp 98.6°F | Wt 151.0 lb

## 2015-05-31 DIAGNOSIS — H6123 Impacted cerumen, bilateral: Secondary | ICD-10-CM

## 2015-05-31 DIAGNOSIS — L700 Acne vulgaris: Secondary | ICD-10-CM

## 2015-05-31 MED ORDER — CLINDAMYCIN PHOS-BENZOYL PEROX 1-5 % EX GEL: Freq: Two times a day (BID) | CUTANEOUS | Status: DC

## 2015-05-31 NOTE — Progress Notes (Signed)
CC: Crystal Cline is a 24 y.o. female is here for red lesion beside the right ear   Subjective: HPI:  Due to a learning disability the mother provides the majority of the history. Mother reports that ever since December 2015 her daughter has suffered from a red swollen patch on her right ear. Localized just below the right ear but occasionally involves the ear itself. In 2015 when she was seen here by Luvenia Starch she was prescribed doxycycline when the lesion was much more swollen than it appears today. The mother states he was doing a great job but she was fearful that it was a strong medication and could cause more harm than good and stopped it after a few doses. Since then they've been trying to keep the area clean with some difficulty since her daughter seems to pick at it. The mother believes that it's painful as the patient guards it every once in while. Over the past few months they've been able to squeeze some pus out of it. No other interventions as of yet. There has been no fevers, chills, nausea, swollen lymph nodes or rashes elsewhere. Symptoms are currently mild in severity per the family.   Review Of Systems Outlined In HPI  Past Medical History  Diagnosis Date  . Autism   . History of impacted cerumen     Past Surgical History  Procedure Laterality Date  . Tonsillectomy and adenoidectomy    . Tympanostomy tube placement Bilateral    Family History  Problem Relation Age of Onset  . Allergies Mother   . Allergies Father   . Cancer Maternal Grandfather   . Cancer Paternal Grandmother   . Cancer Paternal Grandfather     History   Social History  . Marital Status: Single    Spouse Name: N/A  . Number of Children: N/A  . Years of Education: N/A   Occupational History  . Not on file.   Social History Main Topics  . Smoking status: Never Smoker   . Smokeless tobacco: Never Used  . Alcohol Use: No  . Drug Use: No  . Sexual Activity: No   Other Topics Concern  . Not on  file   Social History Narrative   Marital Status: Single    Children:  None    Pets: None    Living Situation: Lives with parents Crystal Cline and Crystal Cline)    Education:  She was home schooled due to her special needs (Autism)    Tobacco Use/Exposure:  None    Alcohol Use: None   Drug Use:  None   Diet:  Regular   Exercise:  Bicycle  (6 miles daily- 6 x per week)     Hobbies: Puzzles and Gardening               Objective: BP 114/83 mmHg  Pulse 102  Temp(Src) 98.6 F (37 C) (Oral)  Wt 151 lb (68.493 kg)  Vital signs reviewed. General: Alert,  No Acute Distress HEENT: Pupils equal, round, reactive to light. Conjunctivae clear.  External ears canals with moderate amount of cerumen.  Moist mucous membranes. Just below the right ear lobe there is a small cystic structure with a central pore. There is no induration and she does not appear to be guarding from it. It was further evaluated with bedside ultrasound which showed no underlying hypoechoic structure. Lungs: Clear and comfortable work of breathing, speaking in full sentences without accessory muscle use. Cardiac: Regular rate and rhythm.  Neuro: CN  II-XII grossly intact, gait normal. Extremities: No peripheral edema.  Strong peripheral pulses.  Mental Status: No depression, anxiety, nor agitation. Logical though process. Skin: Warm and dry.  Assessment & Plan: Aella was seen today for red lesion beside the right ear.  Diagnoses and all orders for this visit:  Cystic acne Orders: -     clindamycin-benzoyl peroxide (BENZACLIN) gel; Apply topically 2 (two) times daily. For ten days.  Excessive cerumen in ear canal, bilateral   Cystic acne: High suspicion of persistent cystic acne just below the right ear lobe. Since it responded well to doxycycline but mother was afraid of a systemic medication will treat topically with clindamycin and benzyl peroxide. I cannot rule out infected sebaceous cyst and if topical therapy does not  improve the lesion after 1 week of asked her to call me for referral to dermatology for consideration of excision. Mother expresses frustration with keeping her daughter's ear canals clean of wax, discussed 50-50 water and hydrogen peroxide in a soaked cotton ball placed in the ear for at least 5 minutes a few times a week to help dissolve ear wax buildup.  25 minutes spent face-to-face during visit today of which at least 50% was counseling or coordinating care regarding: 1. Cystic acne   2. Excessive cerumen in ear canal, bilateral      Return if symptoms worsen or fail to improve.

## 2015-10-05 ENCOUNTER — Ambulatory Visit (INDEPENDENT_AMBULATORY_CARE_PROVIDER_SITE_OTHER): Payer: Managed Care, Other (non HMO) | Admitting: Physician Assistant

## 2015-10-05 ENCOUNTER — Encounter: Payer: Self-pay | Admitting: Physician Assistant

## 2015-10-05 VITALS — BP 105/64 | HR 82 | Ht 62.0 in | Wt 151.0 lb

## 2015-10-05 DIAGNOSIS — R21 Rash and other nonspecific skin eruption: Secondary | ICD-10-CM | POA: Diagnosis not present

## 2015-10-05 MED ORDER — NYSTATIN 100000 UNIT/GM EX CREA: 1.0000 "application " | TOPICAL_CREAM | Freq: Two times a day (BID) | CUTANEOUS | Status: DC

## 2015-10-05 NOTE — Progress Notes (Addendum)
Crystal Cline is a 24 y.o. female who presents to [facility]: [facility] today for rash under both armpits. Crystal Cline is largely nonverbal so all history is obtained via her mother. Ms. Gauer noticed that Crystal Cline had rashes on both her armpits today while they were trying on shirts in a clothing stored. Her mother states that she has not seen her itching her armpits and she does not seem to be in pain. She has not noticed any flaking, raised areas, lesions or exudate. She changed Crystal Cline's deodorant approximately a month ago but states that it is all natural without dyes, colors or fragrances. She denies recent change in detergent or use of lotion.   Past Medical History  Diagnosis Date  . Autism   . History of impacted cerumen    Past Surgical History  Procedure Laterality Date  . Tonsillectomy and adenoidectomy    . Tympanostomy tube placement Bilateral    Social History  Substance Use Topics  . Smoking status: Never Smoker   . Smokeless tobacco: Never Used  . Alcohol Use: No   family history includes Allergies in her father and mother; Cancer in her maternal grandfather, paternal grandfather, and paternal grandmother.  ROS as above Medications: Current Outpatient Prescriptions  Medication Sig Dispense Refill  . nystatin cream (MYCOSTATIN) Apply 1 application topically 2 (two) times daily. 60 g 0   No current facility-administered medications for this visit.   No Known Allergies   Exam:  BP 105/64 mmHg  Pulse 82  Ht 5\' 2"  (1.575 m)  Wt 151 lb (68.493 kg)  BMI 27.61 kg/m2 Gen: WDWN female in no acute distress.  HEENT: EOMI,  MMM Lungs: Normal work of breathing. CTABL Heart: RRR no MRG Abdomen & chest: Several areas of hyperpigmented skin with slightly raised borders near breasts. Exts: Brisk capillary refill, warm and well perfused. Approximately 8 mm round hyperpigmented rash underneath left armpit; approximately 6 mm round hyperpigmented rash  underneath right armpit.   Assessment: Tinea versicolor based on lack of symptoms and several other areas of similar appearance. Possibly intertrigo based on appearance and location.  Plan: Patient instructed to use selsun blue on area for 15 minutes daily prior to showering for tinea versicolor. Patient prescribed Mycostatin cream for coverage of intertrigo as well. Patient and mother instructed to return if symptoms persist for more than 14 days, symptoms worsen or change in nature.

## 2015-10-05 NOTE — Patient Instructions (Signed)
Tinea Versicolor Tinea versicolor is a common fungal infection of the skin. It causes a rash that appears as light or dark patches on the skin. The rash most often occurs on the chest, back, neck, or upper arms. This condition is more common during warm weather. Other than affecting how your skin looks, tinea versicolor usually does not cause other problems. In most cases, the infection goes away in a few weeks with treatment. It may take a few months for the patches on your skin to clear up. CAUSES Tinea versicolor occurs when a type of fungus that is normally present on the skin starts to overgrow. This fungus is a kind of yeast. The exact cause of the overgrowth is not known. This condition cannot be passed from one person to another (noncontagious). RISK FACTORS This condition is more likely to develop when certain factors are present, such as:  Heat and humidity.  Sweating too much.  Hormone changes.  Oily skin.  A weak defense (immune) system. SYMPTOMS Symptoms of this condition may include:  A rash on your skin that is made up of light or dark patches. The rash may have:  Patches of tan or pink spots on light skin.  Patches of white or brown spots on dark skin.  Patches of skin that do not tan.  Well-marked edges.  Scales on the discolored areas.  Mild itching. DIAGNOSIS A health care provider can usually diagnose this condition by looking at your skin. During the exam, he or she may use ultraviolet light to help determine the extent of the infection. In some cases, a skin sample may be taken by scraping the rash. This sample will be viewed under a microscope to check for yeast overgrowth. TREATMENT Treatment for this condition may include:  Dandruff shampoo that is applied to the affected skin during showers or bathing.  Over-the-counter medicated skin cream, lotion, or soaps.  Prescription antifungal medicine in the form of skin cream or pills.  Medicine to help  reduce itching. HOME CARE INSTRUCTIONS  Take medicines only as directed by your health care provider.  Apply dandruff shampoo to the affected area if told to do so by your health care provider. You may be instructed to scrub the affected skin for several minutes each day.  Do not scratch the affected area of skin.  Avoid hot and humid conditions.  Do not use tanning booths.  Try to avoid sweating a lot. SEEK MEDICAL CARE IF:  Your symptoms get worse.  You have a fever.  You have redness, swelling, or pain at the site of your rash.  You have fluid, blood, or pus coming from your rash.  Your rash returns after treatment.   This information is not intended to replace advice given to you by your health care provider. Make sure you discuss any questions you have with your health care provider.   Document Released: 11/16/2000 Document Revised: 12/10/2014 Document Reviewed: 08/31/2014 Elsevier Interactive Patient Education 2016 Elsevier Inc.  

## 2015-10-06 LAB — KOH PREP: RESULT - KOH: NONE SEEN

## 2015-10-11 ENCOUNTER — Telehealth: Payer: Self-pay | Admitting: Physician Assistant

## 2015-10-11 NOTE — Telephone Encounter (Signed)
Mother called to state they never picked up the Rx for Rash, but have been using selsun Blue which has helped a lot. Will route to PCP.

## 2015-12-23 ENCOUNTER — Ambulatory Visit (INDEPENDENT_AMBULATORY_CARE_PROVIDER_SITE_OTHER): Payer: Managed Care, Other (non HMO) | Admitting: Physician Assistant

## 2015-12-23 ENCOUNTER — Encounter: Payer: Self-pay | Admitting: Physician Assistant

## 2015-12-23 VITALS — BP 112/59 | HR 85 | Ht 62.0 in | Wt 151.0 lb

## 2015-12-23 DIAGNOSIS — R319 Hematuria, unspecified: Secondary | ICD-10-CM | POA: Diagnosis not present

## 2015-12-23 DIAGNOSIS — R350 Frequency of micturition: Secondary | ICD-10-CM

## 2015-12-23 DIAGNOSIS — N926 Irregular menstruation, unspecified: Secondary | ICD-10-CM

## 2015-12-23 LAB — POCT URINALYSIS DIPSTICK
Bilirubin, UA: NEGATIVE
GLUCOSE UA: NEGATIVE
KETONES UA: NEGATIVE
LEUKOCYTES UA: NEGATIVE
Nitrite, UA: NEGATIVE
Protein, UA: NEGATIVE
SPEC GRAV UA: 1.025
Urobilinogen, UA: 0.2
pH, UA: 6

## 2015-12-23 NOTE — Progress Notes (Signed)
   Subjective:    Patient ID: Crystal Cline, female    DOB: Apr 15, 1991, 25 y.o.   MRN: [id number]  HPI  Pt is a 25 yo female who presents to the clinic with more frequent night time urination and spotting that is out of normal. Pt has autism and her mother speaks for her. LMP was 27th of December and very heavy; however she has been spotting since. They did travel to [place] to visit her brother and new baby. This stressed patient a lot. No fever, chills, flank pain. She does occasionaly complain of stomach pain. Last year dx with PCOS.   Review of Systems  All other systems reviewed and are negative.      Objective:   Physical Exam  Constitutional: She is oriented to person, place, and time. She appears well-developed and well-nourished.  Cardiovascular: Normal rate, regular rhythm and normal heart sounds.   Pulmonary/Chest: Effort normal and breath sounds normal.  No CVA tenderness.   Abdominal: Soft. Bowel sounds are normal. She exhibits no distension and no mass. There is no tenderness. There is no rebound and no guarding.  Neurological: She is alert and oriented to person, place, and time.  Psychiatric: She has a normal mood and affect. Her behavior is normal.          Assessment & Plan:  Frequent urination- positive for blood. Will get culture and mirco. No overt signs of infection. Since increase is mostly at night consider limiting fluids after 7. Could try medication. Pt's mother declines. Will call if any bacteria growth.   Spotting/irregular cycle- likely due to stress. Stress can cause you to have abnormal period. She has been under a lot of stress with trip to [place] and being around a baby. If continues could get hormonal work up however intense work up was done last year and essentially normal except for elevated testosterone level. Can consider OCP for period control if would like. Mother declined today.

## 2015-12-24 LAB — URINALYSIS, MICROSCOPIC ONLY
CRYSTALS: NONE SEEN [HPF]
Casts: NONE SEEN [LPF]
YEAST: NONE SEEN [HPF]

## 2015-12-25 LAB — URINE CULTURE: Colony Count: >=100000

## 2015-12-26 ENCOUNTER — Encounter: Payer: Self-pay | Admitting: Physician Assistant

## 2015-12-26 DIAGNOSIS — G479 Sleep disorder, unspecified: Secondary | ICD-10-CM | POA: Insufficient documentation

## 2015-12-27 ENCOUNTER — Ambulatory Visit (INDEPENDENT_AMBULATORY_CARE_PROVIDER_SITE_OTHER): Payer: Managed Care, Other (non HMO) | Admitting: Physician Assistant

## 2015-12-27 VITALS — BP 108/77 | HR 85 | Temp 97.2°F | Ht 62.0 in | Wt 151.0 lb

## 2015-12-27 DIAGNOSIS — R319 Hematuria, unspecified: Secondary | ICD-10-CM

## 2015-12-27 LAB — POCT URINALYSIS DIPSTICK
BILIRUBIN UA: NEGATIVE
Glucose, UA: NEGATIVE
KETONES UA: NEGATIVE
LEUKOCYTES UA: NEGATIVE
Nitrite, UA: NEGATIVE
PH UA: 8
Protein, UA: NEGATIVE
Spec Grav, UA: 1.015
Urobilinogen, UA: 0.2

## 2015-12-27 NOTE — Progress Notes (Signed)
   Subjective:    Patient ID: Crystal Cline, female    DOB: 1991-09-19, 25 y.o.   MRN: [id number]  HPI  Patient and mother come in for a urine culture  Review of Systems     Objective:   Physical Exam  BP 108/77 mmHg  Pulse 85  Temp(Src) 97.2 F (36.2 C) (Oral)  Ht 5\' 2"  (1.575 m)  Wt 151 lb (68.493 kg)  BMI 27.61 kg/m2       Assessment & Plan:   Clean catch urine sample obtained.  Will notify patient when results are in.

## 2015-12-30 LAB — URINE CULTURE

## 2015-12-30 MED ORDER — AMOXICILLIN-POT CLAVULANATE 875-125 MG PO TABS: 1.0000 | ORAL_TABLET | Freq: Two times a day (BID) | ORAL | Status: DC

## 2015-12-30 NOTE — Addendum Note (Signed)
Addended by: Donella Stade on: 12/30/2015 08:29 AM   Modules accepted: Orders

## 2016-06-09 ENCOUNTER — Encounter: Payer: Self-pay | Admitting: Emergency Medicine

## 2016-06-09 ENCOUNTER — Emergency Department
Admission: EM | Admit: 2016-06-09 | Discharge: 2016-06-09 | Disposition: A | Discharge status: Home / Self Care | Payer: Managed Care, Other (non HMO) | Source: Home / Self Care | Attending: Family Medicine | Admitting: Family Medicine

## 2016-06-09 DIAGNOSIS — H6092 Unspecified otitis externa, left ear: Secondary | ICD-10-CM | POA: Diagnosis not present

## 2016-06-09 DIAGNOSIS — H6123 Impacted cerumen, bilateral: Secondary | ICD-10-CM | POA: Diagnosis not present

## 2016-06-09 MED ORDER — CIPROFLOXACIN-DEXAMETHASONE 0.3-0.1 % OT SUSP: 4.0000 [drp] | Freq: Two times a day (BID) | OTIC | Status: DC

## 2016-06-09 MED ORDER — AMOXICILLIN-POT CLAVULANATE 875-125 MG PO TABS: 1.0000 | ORAL_TABLET | Freq: Two times a day (BID) | ORAL | Status: DC

## 2016-06-09 NOTE — ED Notes (Signed)
Pt c/o left ear pain and drainage with odor x1 week.

## 2016-06-09 NOTE — Discharge Instructions (Signed)
If cold symptoms increase, try the following: Take plain guaifenesin (1200mg  extended release tabs such as Mucinex) twice daily, with plenty of water, for cough and congestion.  May add Pseudoephedrine (30mg , one or two every 4 to 6 hours) for sinus congestion.  Get adequate rest.   Try warm salt water gargles for sore throat.  Stop all antihistamines for now, and other non-prescription cough/cold preparations. May take Delsym Cough Suppressant at bedtime for nighttime cough.  Follow-up with ENT physician for ear wax removal.   Ear Drops, Adult You have been diagnosed with a condition requiring you to put drops of medicine into your outer ear. HOME CARE INSTRUCTIONS   Put drops in the affected ear as instructed. After putting the drops in, you will need to lie down with the affected ear facing up for ten minutes so the drops will remain in the ear canal and run down and fill the canal. Continue using the ear drops for as long as directed by your health care provider.  Prior to getting up, put a cotton ball gently in your ear canal. Leave enough of the cotton ball out so it can be easily removed. Do not attempt to push this down into the canal with a cotton-tipped swab or other instrument.  Do not irrigate or wash out your ears if you have had a perforated eardrum or mastoid surgery, or unless instructed to do so by your health care provider.  Keep appointments with your health care provider as instructed.  Finish all medicine, or use for the length of time prescribed by your health care provider. Continue the drops even if your problem seems to be doing well after a couple days, or continue as instructed. SEEK MEDICAL CARE IF:  You become worse or develop increasing pain.  You notice any unusual drainage from your ear (particularly if the drainage has a bad smell).  You develop hearing difficulties.  You experience a serious form of dizziness in which you feel as if the room is spinning,  and you feel nauseated (vertigo).  The outside of your ear becomes red or swollen or both. This may be a sign of an allergic reaction. MAKE SURE YOU:   Understand these instructions.  Will watch your condition.  Will get help right away if you are not doing well or get worse.   This information is not intended to replace advice given to you by your health care provider. Make sure you discuss any questions you have with your health care provider.   Document Released: 11/13/2001 Document Revised: 12/10/2014 Document Reviewed: 06/16/2013 Elsevier Interactive Patient Education [employer].

## 2016-06-09 NOTE — ED Provider Notes (Signed)
CSN: [id number]     Arrival date & time 06/09/16  1041 History   First MD Initiated Contact with Patient 06/09/16 1236     Chief Complaint  Patient presents with  . Otalgia      HPI Comments: Patient has a history of chronic cerumen impaction, and had frequent ear infections as a child.  During the past 6 days she has had a mild sore throat and cough.  Last night she developed a left earache.  She has had malodorous drainage from her left ear for about one week.  No fevers, chills, and sweats.  The history is provided by the patient.    Past Medical History  Diagnosis Date  . Autism   . History of impacted cerumen    Past Surgical History  Procedure Laterality Date  . Tonsillectomy and adenoidectomy    . Tympanostomy tube placement Bilateral    Family History  Problem Relation Age of Onset  . Allergies Mother   . Allergies Father   . Cancer Maternal Grandfather   . Cancer Paternal Grandmother   . Cancer Paternal Grandfather    Social History  Substance Use Topics  . Smoking status: Never Smoker   . Smokeless tobacco: Never Used  . Alcohol Use: No   OB History    No data available     Review of Systems + mild sore throat + cough No pleuritic pain No wheezing No nasal congestion ? post-nasal drainage No sinus pain/pressure No itchy/red eyes + earache No hemoptysis No SOB No fever/chills No nausea No vomiting No abdominal pain No diarrhea No urinary symptoms No skin rash No fatigue No myalgias No headache    Allergies  Review of patient's allergies indicates no known allergies.  Home Medications   Prior to Admission medications   Medication Sig Start Date End Date Taking? Authorizing Provider  amoxicillin-clavulanate (AUGMENTIN) 875-125 MG tablet Take 1 tablet by mouth 2 (two) times daily. Take with food. 06/09/16   Kandra Nicolas, MD  ciprofloxacin-dexamethasone (CIPRODEX) otic suspension Place 4 drops into the left ear 2 (two) times daily. Use for 7  days 06/09/16   Kandra Nicolas, MD  Multiple Vitamins tablet Take by mouth.    Historical Provider, MD  nystatin cream (MYCOSTATIN) Apply 1 application topically 2 (two) times daily. 10/05/15   Donella Stade, PA-C   Meds Ordered and Administered this Visit  Medications - No data to display  BP 126/85 mmHg  Pulse 102  Temp(Src) 98.2 F (36.8 C) (Oral)  Wt 152 lb (68.947 kg)  SpO2 100% No data found.   Physical Exam Nursing notes and Vital Signs reviewed. Appearance:  Patient appears stated age, and in no acute distress Eyes:  Pupils are equal, round, and reactive to light and accomodation.  Extraocular movement is intact.  Conjunctivae are not inflamed  Ears:  Right canal occluded with cerumen; unable to visualize right tympanic membrane.  Left canal has tenderness with insertion of speculum.  Moist cerumen present in left canal  Nose:   Normal turbinates.  No sinus tenderness.   Pharynx:  Normal Neck:  Supple.  Tender enlarged posterior/lateral nodes are palpated bilaterally  Lungs:  Clear to auscultation.  Breath sounds are equal.  Moving air well. Heart:  Regular rate and rhythm without murmurs, rubs, or gallops.  Abdomen:  Nontender without masses or hepatosplenomegaly.  Bowel sounds are present.  No CVA or flank tenderness.  Extremities:  No edema.  Skin:  No rash  present.   ED Course  Procedures   MDM   1. Bilateral impacted cerumen   2. Otitis externa, left; suspect otitis media as well.   Suspect developing viral URI. Begin Augmentin 875 BID, and Ciprdex otic suspension to left ear.  If cold symptoms increase, try the following: Take plain guaifenesin (1200mg  extended release tabs such as Mucinex) twice daily, with plenty of water, for cough and congestion.  May add Pseudoephedrine (30mg , one or two every 4 to 6 hours) for sinus congestion.  Get adequate rest.   Try warm salt water gargles for sore throat.  Stop all antihistamines for now, and other non-prescription  cough/cold preparations. May take Delsym Cough Suppressant at bedtime for nighttime cough.  Follow-up with ENT physician for ear wax removal.   Kandra Nicolas, MD 06/17/16 1511

## 2016-06-11 ENCOUNTER — Ambulatory Visit (INDEPENDENT_AMBULATORY_CARE_PROVIDER_SITE_OTHER): Payer: Managed Care, Other (non HMO) | Admitting: Physician Assistant

## 2016-06-11 ENCOUNTER — Encounter: Payer: Self-pay | Admitting: Physician Assistant

## 2016-06-11 VITALS — BP 124/79 | HR 105 | Temp 99.9°F | Wt 154.0 lb

## 2016-06-11 DIAGNOSIS — H6092 Unspecified otitis externa, left ear: Secondary | ICD-10-CM | POA: Diagnosis not present

## 2016-06-11 MED ORDER — FLUCONAZOLE 100 MG PO TABS: ORAL_TABLET | ORAL | Status: DC

## 2016-06-11 MED ORDER — CIPROFLOXACIN HCL 750 MG PO TABS: 750.0000 mg | ORAL_TABLET | Freq: Two times a day (BID) | ORAL | Status: DC

## 2016-06-11 NOTE — Progress Notes (Signed)
   Subjective:    Patient ID: Crystal Cline, female    DOB: 06/14/1991, 25 y.o.   MRN: [id number]  HPI Patient present to clinic due to complaint of worsening external ear infection. She reports being seen in the urgent care 3 days ago for external otitis. She was given Augmentin and Ciprodex and has completed 3 days of treatment with no improvement. She states that her left external ear is now erythematous and swollen. She also reports a low grade fever if about 100 F for the last several days, for which she is taking Tylenol. She is concerned that "ear drops are not working." She denies headache, fatigue, N/V/D, sore throat, and cough.    Review of Systems  Constitutional: Positive for fever.  HENT: Negative for sneezing and sore throat.        Reports feeling her left ear is plugged up. No current drainage.  Eyes: Negative.   Respiratory: Negative.   Cardiovascular: Negative.   Gastrointestinal: Negative.   Endocrine: Negative.   Genitourinary: Negative.   Musculoskeletal: Negative.   Skin:       Redness around the left ear and cheek   Allergic/Immunologic: Negative.   Neurological: Negative.   Hematological: Negative.   Psychiatric/Behavioral: Negative.        Objective:   Physical Exam  Constitutional: She appears well-developed and well-nourished.  HENT:  Head: Normocephalic and atraumatic.  Right Ear: Tympanic membrane normal.  Left Ear: There is swelling and tenderness. There is mastoid tenderness.  Mouth/Throat: Oropharynx is clear and moist. No oropharyngeal exudate.  Left Ear canal is swollen. Unable to visualize TM. There does appear to be some discharge that has crusted over. Entire left ear auricle is erythematous with redness and tenderness extending into mastoid and pre-auricular area.   Eyes: Pupils are equal, round, and reactive to light.  Neck: Normal range of motion. Neck supple.  Cardiovascular: Normal rate, regular rhythm and normal heart sounds.    Pulmonary/Chest: Effort normal and breath sounds normal.  Abdominal: Soft. Bowel sounds are normal.  Lymphadenopathy:    She has no cervical adenopathy.  Neurological: She is alert.  Skin: She is diaphoretic.          Assessment & Plan:  Left external otitis- not improving. Concerned that is worsening. Discussed with supervising MD and advised to treat for concern for fungal infection. Stopped augmentin and ciprodex. Started diflucan and oral cipro. ENT appt made for Wednesday at 3pm. Discussed if began to worsen or fever spiked to call office.

## 2016-06-11 NOTE — Patient Instructions (Signed)
ciprofloxcin and diflucan.  Stop all other medications.  Will make ENT appt.

## 2016-06-12 ENCOUNTER — Telehealth: Payer: Self-pay | Admitting: Physician Assistant

## 2016-06-12 NOTE — Telephone Encounter (Signed)
Pt's mother called and stating the Pt's fever ranged from 100.2-100.6 last night. She called the after hours line and was advised to alternate between tylenol and ibuprofen. Mother wants to know what OTC sleep aid she can give daughter. States that due to pain, Pt has "not really slept since Saturday." Mother does mention she has given Pt benadryl before and it does not make her sleepy. Will route to PCP.

## 2016-06-12 NOTE — Telephone Encounter (Signed)
unisom OTC for sleep 25-50 mg.  If her temperature is staying elevated and the pain is not controlled with tylenol and ibuprofen as well as redness continuing and spreading around ear. I would advise to go to ER.

## 2016-06-12 NOTE — Telephone Encounter (Signed)
Mother advised of recommendation regarding Unisom OTC. At this time, Pt has no fever. She does have specialist appt tomorrow, OK to wait until then for evaluation. Mother advised if fever returns to visit ED. Verbalized understanding.

## 2016-12-14 ENCOUNTER — Ambulatory Visit (INDEPENDENT_AMBULATORY_CARE_PROVIDER_SITE_OTHER): Payer: Commercial Managed Care - PPO | Admitting: Physician Assistant

## 2016-12-14 VITALS — BP 118/84 | HR 118 | Temp 99.1°F | Wt 155.0 lb

## 2016-12-14 DIAGNOSIS — J069 Acute upper respiratory infection, unspecified: Secondary | ICD-10-CM

## 2016-12-14 DIAGNOSIS — R000 Tachycardia, unspecified: Secondary | ICD-10-CM | POA: Diagnosis not present

## 2016-12-14 MED ORDER — IPRATROPIUM BROMIDE 0.06 % NA SOLN: 1.0000 | Freq: Four times a day (QID) | NASAL | 0 refills | Status: DC | PRN

## 2016-12-14 NOTE — Progress Notes (Signed)
HPI:                                                                Crystal Cline is a 26 y.o. female who presents to [place] [place] today for a sick visit  She is accompanied by her mother, who provides the history  Sinus Problem  This is a new problem. The current episode started in the past 7 days (5 days). The problem has been gradually improving since onset. There has been no fever. Associated symptoms include congestion and a sore throat. Pertinent negatives include no chills, coughing, ear pain, headaches, hoarse voice or shortness of breath. Past treatments include oral decongestants (Sudafed, OTC sleep med).    Health Maintenance Health Maintenance  Topic Date Due  . HIV Screening  05/16/2004  . INFLUENZA VACCINE  07/03/2016  . PAP SMEAR  10/05/2016  . TETANUS/TDAP  05/15/2018 (Originally 05/16/2008)     Past Medical History:  Diagnosis Date  . Autism   . History of impacted cerumen    Past Surgical History:  Procedure Laterality Date  . TONSILLECTOMY AND ADENOIDECTOMY    . TYMPANOSTOMY TUBE PLACEMENT Bilateral    Social History  Substance Use Topics  . Smoking status: Never Smoker  . Smokeless tobacco: Never Used  . Alcohol use No   family history includes Allergies in her father and mother; Cancer in her maternal grandfather, paternal grandfather, and paternal grandmother.  Review of Systems  Constitutional: Negative for chills and fever.  HENT: Positive for congestion, nosebleeds and sore throat. Negative for ear pain and hoarse voice.   Respiratory: Negative for cough, shortness of breath and wheezing.   Cardiovascular: Negative for chest pain and palpitations.  Musculoskeletal: Negative for myalgias.  Skin: Negative for rash.  Neurological: Negative for headaches.     Medications: Current Outpatient Prescriptions  Medication Sig Dispense Refill  . Multiple Vitamins tablet Take by mouth.    Marland Kitchen  ipratropium (ATROVENT) 0.06 % nasal spray Place 1 spray into both nostrils 4 (four) times daily as needed for rhinitis. 15 mL 0   No current facility-administered medications for this visit.    No Known Allergies     Objective:  BP 118/84   Pulse (!) 118   Temp 99.1 F (37.3 C) (Oral)   Wt 155 lb (70.3 kg)   BMI 28.35 kg/m  Gen: well-groomed, cooperative,  ill-appearing, not toxic-appearing, no distress HEENT: normal conjunctiva, TM's clear, there is rhinorrhea and dried blood in bilateral nares, nasal turbinates are edematous, oropharynx clear, moist mucus membranes Lungs: Normal work of breathing, clear to auscultation bilaterally Heart: Tachycardic, regular rhythm, s1 and s2 distinct, no murmurs, clicks or rubs Abd: Soft. Nondistended, Nontender Skin: warm and dry, no rashes or lesions on exposed skin  No results found for this or any previous visit (from the past 72 hour(s)). No results found.    Assessment and Plan: 26 y.o. female with   Acute upper respiratory infection - symptomatic management - ipratropium (ATROVENT) 0.06 % nasal spray; Place 1 spray into both nostrils 4 (four) times daily as needed for rhinitis.  Dispense: 15 mL; Refill: 0  Tachycardia with HR 100-120 bpm - patient has had 2 tabs of Sudafed this morning. No chest  pain or dyspnea. She is tolerating PO - instructed patient's mother to reduce Sudafed to 1 tab and increase fluid intake  Patient education and anticipatory guidance given Patient agrees with treatment plan Follow-up as needed if symptoms worsen or fail to improve  Darlyne Russian PA-C

## 2016-12-14 NOTE — Patient Instructions (Signed)
Encourage hydration - push extra clear liquids Return if no improvement by Monday  Upper Respiratory Infection, Adult Most upper respiratory infections (URIs) are a viral infection of the air passages leading to the lungs. A URI affects the nose, throat, and upper air passages. The most common type of URI is nasopharyngitis and is typically referred to as "the common cold." URIs run their course and usually go away on their own. Most of the time, a URI does not require medical attention, but sometimes a bacterial infection in the upper airways can follow a viral infection. This is called a secondary infection. Sinus and middle ear infections are common types of secondary upper respiratory infections. Bacterial pneumonia can also complicate a URI. A URI can worsen asthma and chronic obstructive pulmonary disease (COPD). Sometimes, these complications can require emergency medical care and may be life threatening. What are the causes? Almost all URIs are caused by viruses. A virus is a type of germ and can spread from one person to another. What increases the risk? You may be at risk for a URI if:  You smoke.  You have chronic heart or lung disease.  You have a weakened defense (immune) system.  You are very young or very old.  You have nasal allergies or asthma.  You work in crowded or poorly ventilated areas.  You work in health care facilities or schools. What are the signs or symptoms? Symptoms typically develop 2-3 days after you come in contact with a cold virus. Most viral URIs last 7-10 days. However, viral URIs from the influenza virus (flu virus) can last 14-18 days and are typically more severe. Symptoms may include:  Runny or stuffy (congested) nose.  Sneezing.  Cough.  Sore throat.  Headache.  Fatigue.  Fever.  Loss of appetite.  Pain in your forehead, behind your eyes, and over your cheekbones (sinus pain).  Muscle aches. How is this diagnosed? Your health  care provider may diagnose a URI by:  Physical exam.  Tests to check that your symptoms are not due to another condition such as:  Strep throat.  Sinusitis.  Pneumonia.  Asthma. How is this treated? A URI goes away on its own with time. It cannot be cured with medicines, but medicines may be prescribed or recommended to relieve symptoms. Medicines may help:  Reduce your fever.  Reduce your cough.  Relieve nasal congestion. Follow these instructions at home:  Take medicines only as directed by your health care provider.  Gargle warm saltwater or take cough drops to comfort your throat as directed by your health care provider.  Use a warm mist humidifier or inhale steam from a shower to increase air moisture. This may make it easier to breathe.  Drink enough fluid to keep your urine clear or pale yellow.  Eat soups and other clear broths and maintain good nutrition.  Rest as needed.  Return to work when your temperature has returned to normal or as your health care provider advises. You may need to stay home longer to avoid infecting others. You can also use a face mask and careful hand washing to prevent spread of the virus.  Increase the usage of your inhaler if you have asthma.  Do not use any tobacco products, including cigarettes, chewing tobacco, or electronic cigarettes. If you need help quitting, ask your health care provider. How is this prevented? The best way to protect yourself from getting a cold is to practice good hygiene.  Avoid oral or  hand contact with people with cold symptoms.  Wash your hands often if contact occurs. There is no clear evidence that vitamin C, vitamin E, echinacea, or exercise reduces the chance of developing a cold. However, it is always recommended to get plenty of rest, exercise, and practice good nutrition. Contact a health care provider if:  You are getting worse rather than better.  Your symptoms are not controlled by  medicine.  You have chills.  You have worsening shortness of breath.  You have brown or red mucus.  You have yellow or brown nasal discharge.  You have pain in your face, especially when you bend forward.  You have a fever.  You have swollen neck glands.  You have pain while swallowing.  You have white areas in the back of your throat. Get help right away if:  You have severe or persistent:  Headache.  Ear pain.  Sinus pain.  Chest pain.  You have chronic lung disease and any of the following:  Wheezing.  Prolonged cough.  Coughing up blood.  A change in your usual mucus.  You have a stiff neck.  You have changes in your:  Vision.  Hearing.  Thinking.  Mood. This information is not intended to replace advice given to you by your health care provider. Make sure you discuss any questions you have with your health care provider. Document Released: 05/15/2001 Document Revised: 07/22/2016 Document Reviewed: 02/24/2014 Elsevier Interactive Patient Education  2017 [employer].

## 2017-05-23 ENCOUNTER — Encounter: Payer: Self-pay | Admitting: Osteopathic Medicine

## 2017-05-23 ENCOUNTER — Ambulatory Visit (INDEPENDENT_AMBULATORY_CARE_PROVIDER_SITE_OTHER): Payer: Commercial Managed Care - PPO | Admitting: Osteopathic Medicine

## 2017-05-23 VITALS — BP 125/84 | HR 96 | Temp 97.8°F | Wt 157.0 lb

## 2017-05-23 DIAGNOSIS — R6889 Other general symptoms and signs: Secondary | ICD-10-CM | POA: Diagnosis not present

## 2017-05-23 DIAGNOSIS — J029 Acute pharyngitis, unspecified: Secondary | ICD-10-CM | POA: Diagnosis not present

## 2017-05-23 LAB — POCT RAPID STREP A (OFFICE): Rapid Strep A Screen: NEGATIVE

## 2017-05-23 NOTE — Progress Notes (Signed)
HPI: Crystal Cline is a 26 y.o. female who presents to [place] 05/23/17 for chief complaint of:  Chief Complaint  Patient presents with  . Sore Throat    Acute Illness: History limited by intellectual disability. Mom accompanies patient to the visit. Mom states that she has been complaining of throat itching, has not noticed any increased sniffling, coughing, no fever. Eating and drinking normally. Has tried no medications or other home remedies   Past medical, social and family history reviewed.   Current medications and allergies reviewed.     Review of Systems:  Constitutional: No  fever/chills  HEENT: Has not complained of headache, itchy but not sore throat, mom has noticed no swollen glands  Cardiovascular: Has not complained of chest pain  Respiratory: Mom has noticed no cough or shortness of breath  Gastrointestinal: No vomiting,  No  diarrhea  Skin/Integument:  No  rash   Detailed Exam:  BP 125/84   Pulse 96   Temp 97.8 F (36.6 C) (Oral)   Wt 157 lb (71.2 kg)   BMI 28.72 kg/m   Constitutional:   VSS, see above.   General Appearance: alert, well-developed, well-nourished, NAD  Eyes:   Normal lids and conjunctive, non-icteric sclera  Ears, Nose, Mouth, Throat:   Normal external inspection ears/nares  Normal mouth/lips/gums, MMM  normal TM  posterior pharynx without erythema, without exudate  nasal mucosa normal  Skin:  Normal inspection, no rash or concerning lesions noted on limited exam  Neck:   No masses, trachea midline. normal lymph nodes  Respiratory:   Normal respiratory effort.   No  wheeze/rhonchi/rales  Cardiovascular:   S1/S2 normal, no murmur/rub/gallop auscultated. RRR.   Results for orders placed or performed in visit on 05/23/17 (from the past 72 hour(s))  POCT rapid strep A     Status: None   Collection Time: 05/23/17 10:51 AM  Result Value Ref Range   Rapid Strep A  Screen Negative Negative   No results found.   ASSESSMENT/PLAN: In absence of anything concerning on physical exam, negative strep test, no other significant symptoms to indicate URI, will try treating for postnasal drip, consider ENT referral if no improvement.  Throat symptom - Plan: POCT rapid strep A   Patient Instructions  Try OTC Allegra, Zyrtec, Claritin +/- saline nasal spray for postnasal drip If not better, or if worse, would follow up with ENT     Visit summary was printed for the patient with medications and pertinent instructions for patient to review. ER/RTC precautions reviewed. All questions answered. Return if symptoms worsen or fail to improve.

## 2017-05-23 NOTE — Patient Instructions (Signed)
Try OTC Allegra, Zyrtec, Claritin +/- saline nasal spray for postnasal drip If not better, or if worse, would follow up with ENT

## 2017-08-23 ENCOUNTER — Other Ambulatory Visit: Payer: Self-pay

## 2017-08-23 ENCOUNTER — Telehealth: Payer: Self-pay

## 2017-08-23 ENCOUNTER — Other Ambulatory Visit: Payer: Self-pay | Admitting: *Deleted

## 2017-08-23 DIAGNOSIS — L700 Acne vulgaris: Secondary | ICD-10-CM

## 2017-08-23 MED ORDER — CLINDAMYCIN PHOS-BENZOYL PEROX 1-5 % EX GEL: Freq: Two times a day (BID) | CUTANEOUS | 1 refills | Status: DC

## 2017-08-23 NOTE — Telephone Encounter (Signed)
Will send and notify patient.

## 2017-08-23 NOTE — Telephone Encounter (Signed)
Mom called and is asking for a refill on clindamycin gel she used for acne that Dr. Ileene Rubens prescribed.  Can you send a refill? Or does she need an appointment?

## 2017-08-23 NOTE — Telephone Encounter (Signed)
Ok for refill? 

## 2017-09-11 ENCOUNTER — Ambulatory Visit (INDEPENDENT_AMBULATORY_CARE_PROVIDER_SITE_OTHER): Payer: Commercial Managed Care - PPO

## 2017-09-11 ENCOUNTER — Ambulatory Visit (INDEPENDENT_AMBULATORY_CARE_PROVIDER_SITE_OTHER): Payer: Commercial Managed Care - PPO | Admitting: Physician Assistant

## 2017-09-11 ENCOUNTER — Encounter: Payer: Self-pay | Admitting: Physician Assistant

## 2017-09-11 VITALS — BP 115/68 | HR 104 | Ht 62.0 in | Wt 155.0 lb

## 2017-09-11 DIAGNOSIS — R5381 Other malaise: Secondary | ICD-10-CM | POA: Diagnosis not present

## 2017-09-11 DIAGNOSIS — R194 Change in bowel habit: Secondary | ICD-10-CM | POA: Diagnosis not present

## 2017-09-11 DIAGNOSIS — K921 Melena: Secondary | ICD-10-CM

## 2017-09-11 DIAGNOSIS — K5904 Chronic idiopathic constipation: Secondary | ICD-10-CM | POA: Diagnosis not present

## 2017-09-11 DIAGNOSIS — R1084 Generalized abdominal pain: Secondary | ICD-10-CM | POA: Diagnosis not present

## 2017-09-11 DIAGNOSIS — R195 Other fecal abnormalities: Secondary | ICD-10-CM

## 2017-09-11 NOTE — Progress Notes (Addendum)
Subjective:    Patient ID: Crystal Cline, female    DOB: 12/02/1991, 26 y.o.   MRN: [id number]  HPI  Pt is a 26 yo pleasant female who presents to the clinic with her mother. Patient has some global delay and her mother communicates for her. Her mother is concerned about constipation and stool changes. Patient has a hx of constipation. She used miralax for a long time but stopped about 6 months ago. She very often complains of upper abdominal pain and her stools have changed. At times they are small pebbles, then more like "stringy spaggetti noodles". At times she sees some mucus around the stool. Denies any dark tarry stools. She has noticed some dried red blood with hard stools. She denies any n/v/d. Mother is concerned because for last 2 weeks she has seemed more tired than usual. Pt denies any acid reflux.   .. Active Ambulatory Problems    Diagnosis Date Noted  . Menorrhagia with irregular cycle 12/06/2013  . Global developmental delay 01/12/2014  . Autism 01/12/2014  . Adjustment disorder with anxious mood 01/12/2014  . Overbite 01/12/2014  . Elevated testosterone level in female 01/20/2014  . PCOS (polycystic ovarian syndrome) 03/23/2014  . OAB (overactive bladder) 04/12/2014  . Frequent urination 04/12/2014  . Nocturia 04/14/2014  . Urinary frequency 05/11/2014  . Unspecified constipation 08/15/2014  . Sleep trouble 12/26/2015  . Blood in stool 09/11/2017  . Malaise 09/11/2017  . Chronic idiopathic constipation 09/11/2017  . Generalized abdominal pain 09/11/2017  . Vitamin D deficiency 09/12/2017   Resolved Ambulatory Problems    Diagnosis Date Noted  . Impacted cerumen of both ears 06/27/2013  . Candidiasis of vulva 06/27/2013  . Routine gynecological examination 12/06/2013  . Screening for malignant neoplasm of the cervix 12/06/2013   Past Medical History:  Diagnosis Date  . Autism   . History of impacted cerumen       Review of Systems  All other systems  reviewed and are negative.      Objective:   Physical Exam  Constitutional: She is oriented to person, place, and time. She appears well-developed and well-nourished.  HENT:  Head: Normocephalic and atraumatic.  Cardiovascular: Normal rate, regular rhythm and normal heart sounds.   Pulmonary/Chest: Effort normal and breath sounds normal.  Abdominal: Soft. Bowel sounds are normal. She exhibits no distension and no mass. There is tenderness. There is no rebound and no guarding.  Mild tenderness over epigastric area to palpation.   Neurological: She is alert and oriented to person, place, and time.  Psychiatric: She has a normal mood and affect. Her behavior is normal.          Assessment & Plan:  Marland KitchenMarland KitchenJaquia was seen today for diarrhea.  Diagnoses and all orders for this visit:  Generalized abdominal pain -     DG Abd 1 View -     Lipase -     CBC -     Comprehensive metabolic panel -     C-reactive protein -     Ferritin -     Sedimentation rate -     TSH -     Vitamin B12 -     Vit D  25 hydroxy (rtn osteoporosis monitoring)  Chronic idiopathic constipation -     Lipase -     CBC -     Comprehensive metabolic panel -     C-reactive protein -     Ferritin -     Sedimentation  rate -     TSH -     Vitamin B12 -     Vit D  25 hydroxy (rtn osteoporosis monitoring) -     lubiprostone (AMITIZA) 24 MCG capsule; Take 1 capsule (24 mcg total) by mouth 2 (two) times daily with a meal.  Malaise -     Lipase -     CBC -     Comprehensive metabolic panel -     C-reactive protein -     Ferritin -     Sedimentation rate -     TSH -     Vitamin B12 -     Vit D  25 hydroxy (rtn osteoporosis monitoring)  Blood in stool  Change in stool caliber   Suspect constipation causing some of abdominal pain since stopped miralax.  Will get abd xray today.  Will get labs to look at fatigue and make sure pancreas and liver enzymes look good.  No pain over RUQ and symptoms not  suggestive of gallbladder disease. Discussed starting amitiza. Will wait until get results of abd xray.  Pt does not want to start probiotic because seems to make her complain more of abdominal pain.  I suspect blood is more due to constipation.  If persist in next 4 weeks will consider GI referral.   ..Spent 30 minutes with patient and greater than 50 percent of visit spent counseling patient regarding treatment plan.

## 2017-09-12 ENCOUNTER — Encounter: Payer: Self-pay | Admitting: Physician Assistant

## 2017-09-12 DIAGNOSIS — R195 Other fecal abnormalities: Secondary | ICD-10-CM | POA: Insufficient documentation

## 2017-09-12 DIAGNOSIS — E559 Vitamin D deficiency, unspecified: Secondary | ICD-10-CM | POA: Insufficient documentation

## 2017-09-12 LAB — COMPREHENSIVE METABOLIC PANEL
AG Ratio: 1.9 (calc) (ref 1.0–2.5)
ALKALINE PHOSPHATASE (APISO): 50 U/L (ref 33–115)
ALT: 12 U/L (ref 6–29)
AST: 14 U/L (ref 10–30)
Albumin: 4.3 g/dL (ref 3.6–5.1)
BUN: 7 mg/dL (ref 7–25)
CALCIUM: 9.4 mg/dL (ref 8.6–10.2)
CO2: 27 mmol/L (ref 20–32)
Chloride: 106 mmol/L (ref 98–110)
Creat: 0.57 mg/dL (ref 0.50–1.10)
Globulin: 2.3 g/dL (calc) (ref 1.9–3.7)
Glucose, Bld: 87 mg/dL (ref 65–99)
POTASSIUM: 4.2 mmol/L (ref 3.5–5.3)
Sodium: 139 mmol/L (ref 135–146)
Total Bilirubin: 0.3 mg/dL (ref 0.2–1.2)
Total Protein: 6.6 g/dL (ref 6.1–8.1)

## 2017-09-12 LAB — CBC
HCT: 39.7 % (ref 35.0–45.0)
Hemoglobin: 13.4 g/dL (ref 11.7–15.5)
MCH: 29.6 pg (ref 27.0–33.0)
MCHC: 33.8 g/dL (ref 32.0–36.0)
MCV: 87.8 fL (ref 80.0–100.0)
MPV: 9.9 fL (ref 7.5–12.5)
PLATELETS: 294 10*3/uL (ref 140–400)
RBC: 4.52 10*6/uL (ref 3.80–5.10)
RDW: 12.5 % (ref 11.0–15.0)
WBC: 5.1 10*3/uL (ref 3.8–10.8)

## 2017-09-12 LAB — C-REACTIVE PROTEIN: CRP: 0.7 mg/L (ref <8.0)

## 2017-09-12 LAB — SEDIMENTATION RATE: Sed Rate: 6 mm/h (ref 0–20)

## 2017-09-12 LAB — TSH: TSH: 0.53 m[IU]/L

## 2017-09-12 LAB — VITAMIN B12: Vitamin B-12: 402 pg/mL (ref 200–1100)

## 2017-09-12 LAB — FERRITIN: Ferritin: 11 ng/mL (ref 10–154)

## 2017-09-12 LAB — LIPASE: Lipase: 42 U/L (ref 7–60)

## 2017-09-12 LAB — VITAMIN D 25 HYDROXY (VIT D DEFICIENCY, FRACTURES): Vit D, 25-Hydroxy: 18 ng/mL — ABNORMAL LOW (ref 30–100)

## 2017-09-12 MED ORDER — LUBIPROSTONE 24 MCG PO CAPS: 24.0000 ug | ORAL_CAPSULE | Freq: Two times a day (BID) | ORAL | 3 refills | Status: DC

## 2017-09-12 NOTE — Addendum Note (Signed)
Addended by: Donella Stade on: 09/12/2017 10:07 PM   Modules accepted: Level of Service

## 2017-09-16 ENCOUNTER — Telehealth: Payer: Self-pay | Admitting: *Deleted

## 2017-09-16 NOTE — Telephone Encounter (Signed)
Pre Authorization sent to cover my meds. [id number]

## 2017-09-17 NOTE — Telephone Encounter (Signed)
Amitiza has been approved through insurance. Called Pharmacy to let them know

## 2017-09-25 ENCOUNTER — Telehealth: Payer: Self-pay | Admitting: *Deleted

## 2017-09-25 NOTE — Telephone Encounter (Signed)
Pt's mom left vm wanting to know what type of testing can be done because she's noticing more blood in her stool and it's bright red now.  Please advise.

## 2017-09-26 NOTE — Telephone Encounter (Signed)
Spoke with pt's mom this morning & Crystal Cline's stools are more firm now so she's thinking it's probably an internal hemorrhoid.  She is just now able to get the Amitiza from the pharmacy due to the prior authorization process so she wants to give that a try first before we do and referrals.

## 2017-09-26 NOTE — Telephone Encounter (Signed)
How are stools? Are they more soft? Less straining? Constipation and hemorrhoids external and internal are most common cause of bright red blood in stools. But we agreed at last appt that if it was not improving we would send to GI. Are you ok with this? Do you want to stay in [place] or [place]?

## 2017-09-27 NOTE — Telephone Encounter (Signed)
Ok agree with this plan.

## 2017-10-09 ENCOUNTER — Ambulatory Visit: Payer: Commercial Managed Care - PPO | Admitting: Physician Assistant

## 2017-10-09 ENCOUNTER — Encounter: Payer: Self-pay | Admitting: Physician Assistant

## 2017-10-09 VITALS — BP 108/55 | HR 83 | Ht 62.0 in | Wt 154.0 lb

## 2017-10-09 DIAGNOSIS — K5904 Chronic idiopathic constipation: Secondary | ICD-10-CM

## 2017-10-09 DIAGNOSIS — J029 Acute pharyngitis, unspecified: Secondary | ICD-10-CM | POA: Diagnosis not present

## 2017-10-09 DIAGNOSIS — J3489 Other specified disorders of nose and nasal sinuses: Secondary | ICD-10-CM | POA: Diagnosis not present

## 2017-10-09 NOTE — Progress Notes (Signed)
   Subjective:    Patient ID: Crystal Cline, female    DOB: 1991/08/20, 26 y.o.   MRN: [id number]  HPI  Patient is a 26 year old female with global developmental delay and unable to communicate effectively.  Her mother communicates for her today.  She recently got a [place] approved.  She is now taking it twice a day.  She has noticed a great improvement in her bowel movements.  When I show her the [facility] stool scale she is pointing to type IV.  Mother does feel like her complaints of abdominal pain are less often.  She denies any hematochezia or melena.  Patient is having some repetitive throat clearing.  She has complained a few times of sore throat.  She denies any fever, chills, sinus pressure, shortness of breath, cough.  They have not done anything to make better.  .. Active Ambulatory Problems    Diagnosis Date Noted  . Menorrhagia with irregular cycle 12/06/2013  . Global developmental delay 01/12/2014  . Autism 01/12/2014  . Adjustment disorder with anxious mood 01/12/2014  . Overbite 01/12/2014  . Elevated testosterone level in female 01/20/2014  . PCOS (polycystic ovarian syndrome) 03/23/2014  . OAB (overactive bladder) 04/12/2014  . Frequent urination 04/12/2014  . Nocturia 04/14/2014  . Urinary frequency 05/11/2014  . Unspecified constipation 08/15/2014  . Sleep trouble 12/26/2015  . Blood in stool 09/11/2017  . Malaise 09/11/2017  . Chronic idiopathic constipation 09/11/2017  . Generalized abdominal pain 09/11/2017  . Vitamin D deficiency 09/12/2017  . Change in stool caliber 09/12/2017   Resolved Ambulatory Problems    Diagnosis Date Noted  . Impacted cerumen of both ears 06/27/2013  . Candidiasis of vulva 06/27/2013  . Routine gynecological examination 12/06/2013  . Screening for malignant neoplasm of the cervix 12/06/2013   Past Medical History:  Diagnosis Date  . Autism   . History of impacted cerumen         Review of Systems  All other systems  reviewed and are negative.      Objective:   Physical Exam  Constitutional: She is oriented to person, place, and time. She appears well-developed and well-nourished.  HENT:  Head: Normocephalic and atraumatic.  Cardiovascular: Normal rate, regular rhythm and normal heart sounds.  Pulmonary/Chest: Effort normal and breath sounds normal.  Abdominal: Soft. Bowel sounds are normal. She exhibits no distension and no mass. There is no tenderness. There is no rebound and no guarding.  Neurological: She is alert and oriented to person, place, and time.  Psychiatric: She has a normal mood and affect. Her behavior is normal.          Assessment & Plan:  Marland KitchenMarland KitchenDiagnoses and all orders for this visit:  Chronic idiopathic constipation  Sinus drainage  Sore throat   Continue with amitiza for constipation.  I did give patient handout for irritable bowel syndrome and how she could manage some with her diet.  This is for her mother to look over and see if there is any triggers for her constipation.  Follow up as needed.   Discussed etiology is likely viral.  Encourage patient to get a humidifier in her room.  Could consider a short quantity of Tylenol Cold sinus and severe.  Consider sucking on cough drops to help manage the drainage.  If patient has worsening symptoms more than welcome to follow up in the office.

## 2017-10-09 NOTE — Patient Instructions (Addendum)
Tylenol cold/sinus severe for sinus drainage.     Diet for Irritable Bowel Syndrome When you have irritable bowel syndrome (IBS), the foods you eat and your eating habits are very important. IBS may cause various symptoms, such as abdominal pain, constipation, or diarrhea. Choosing the right foods can help ease discomfort caused by these symptoms. Work with your health care provider and dietitian to find the best eating plan to help control your symptoms. What general guidelines do I need to follow?  Keep a food diary. This will help you identify foods that cause symptoms. Write down: ? What you eat and when. ? What symptoms you have. ? When symptoms occur in relation to your meals.  Avoid foods that cause symptoms. Talk with your dietitian about other ways to get the same nutrients that are in these foods.  Eat more foods that contain fiber. Take a fiber supplement if directed by your dietitian.  Eat your meals slowly, in a relaxed setting.  Aim to eat 5-6 small meals per day. Do not skip meals.  Drink enough fluids to keep your urine clear or pale yellow.  Ask your health care provider if you should take an over-the-counter probiotic during flare-ups to help restore healthy gut bacteria.  If you have cramping or diarrhea, try making your meals low in fat and high in carbohydrates. Examples of carbohydrates are pasta, rice, whole grain breads and cereals, fruits, and vegetables.  If dairy products cause your symptoms to flare up, try eating less of them. You might be able to handle yogurt better than other dairy products because it contains bacteria that help with digestion. What foods are not recommended? The following are some foods and drinks that may worsen your symptoms:  Fatty foods, such as [place] fries.  Milk products, such as cheese or ice cream.  Chocolate.  Alcohol.  Products with caffeine, such as coffee.  Carbonated drinks, such as soda.  The items listed  above may not be a complete list of foods and beverages to avoid. Contact your dietitian for more information. What foods are good sources of fiber? Your health care provider or dietitian may recommend that you eat more foods that contain fiber. Fiber can help reduce constipation and other IBS symptoms. Add foods with fiber to your diet a little at a time so that your body can get used to them. Too much fiber at once might cause gas and swelling of your abdomen. The following are some foods that are good sources of fiber:  Apples.  Peaches.  Pears.  Berries.  Figs.  Broccoli (raw).  Cabbage.  Carrots.  Raw peas.  Kidney beans.  Lima beans.  Whole grain bread.  Whole grain cereal.  Where to find more information: [employer] for Functional Gastrointestinal Disorders: www.iffgd.[employer] of Diabetes and Digestive and Kidney Diseases: NetworkAffair.co.za.aspx This information is not intended to replace advice given to you by your health care provider. Make sure you discuss any questions you have with your health care provider. Document Released: 02/09/2004 Document Revised: 04/26/2016 Document Reviewed: 02/19/2014 Elsevier Interactive Patient Education  2018 [employer].

## 2017-10-10 ENCOUNTER — Encounter: Payer: Self-pay | Admitting: Physician Assistant

## 2017-11-17 ENCOUNTER — Other Ambulatory Visit: Payer: Self-pay

## 2017-11-17 ENCOUNTER — Emergency Department
Admission: EM | Admit: 2017-11-17 | Discharge: 2017-11-17 | Disposition: A | Discharge status: Home / Self Care | Payer: Commercial Managed Care - PPO | Source: Home / Self Care | Attending: Family Medicine | Admitting: Family Medicine

## 2017-11-17 ENCOUNTER — Encounter: Payer: Self-pay | Admitting: Emergency Medicine

## 2017-11-17 DIAGNOSIS — J069 Acute upper respiratory infection, unspecified: Secondary | ICD-10-CM | POA: Diagnosis not present

## 2017-11-17 MED ORDER — AMOXICILLIN 500 MG PO CAPS: 500.0000 mg | ORAL_CAPSULE | Freq: Three times a day (TID) | ORAL | 0 refills | Status: DC

## 2017-11-17 NOTE — Discharge Instructions (Signed)
°  You may take 500mg  acetaminophen (Tylenol or combination cough/cold medications) every 4-6 hours or in combination with ibuprofen (Motrin or Advil) 400-600mg  every 6-8 hours as needed for pain, inflammation, and fever.  Be sure to drink at least eight 8oz glasses of water to stay well hydrated and get at least 8 hours of sleep at night, preferably more while sick.

## 2017-11-17 NOTE — ED Provider Notes (Signed)
[facility]    CSN: [id number] Arrival date & time: 11/17/17  1133     History   Chief Complaint Chief Complaint  Patient presents with  . Cough  . Nasal Congestion  . Fever    HPI Markayla Reichart is a 26 y.o. female.   HPI Briauna Gilmartin is a 26 y.o. female with hx of Autism, accompanied by her mother with concern for cough that started 2 weeks ago, started to improve, then started to worsen again accompanied with a low-grade fever: 99-100*F.  Decreased appetite but no vomiting or diarrhea. Mother has been giving pt OTC cough/cold medications that have acetaminophen included to help with pt's symptoms but fever keeps coming back.  No known sick contacts. No hx of asthma.    Past Medical History:  Diagnosis Date  . Autism   . History of impacted cerumen     Patient Active Problem List   Diagnosis Date Noted  . Vitamin D deficiency 09/12/2017  . Change in stool caliber 09/12/2017  . Blood in stool 09/11/2017  . Malaise 09/11/2017  . Chronic idiopathic constipation 09/11/2017  . Generalized abdominal pain 09/11/2017  . Sleep trouble 12/26/2015  . Unspecified constipation 08/15/2014  . Urinary frequency 05/11/2014  . Nocturia 04/14/2014  . OAB (overactive bladder) 04/12/2014  . Frequent urination 04/12/2014  . PCOS (polycystic ovarian syndrome) 03/23/2014  . Elevated testosterone level in female 01/20/2014  . Global developmental delay 01/12/2014  . Autism 01/12/2014  . Adjustment disorder with anxious mood 01/12/2014  . Overbite 01/12/2014  . Menorrhagia with irregular cycle 12/06/2013    Past Surgical History:  Procedure Laterality Date  . TONSILLECTOMY AND ADENOIDECTOMY    . TYMPANOSTOMY TUBE PLACEMENT Bilateral     OB History    No data available       Home Medications    Prior to Admission medications   Medication Sig Start Date End Date Taking? Authorizing Provider  amoxicillin (AMOXIL) 500 MG capsule Take 1 capsule (500 mg total)  by mouth 3 (three) times daily. 11/17/17   Noe Gens, PA-C  clindamycin-benzoyl peroxide (BENZACLIN) gel Apply topically 2 (two) times daily. For ten days. 08/23/17   Breeback, Jade L, PA-C  ipratropium (ATROVENT) 0.06 % nasal spray Place 1 spray into both nostrils 4 (four) times daily as needed for rhinitis. 12/14/16   Trixie Dredge, PA-C  lubiprostone [facility]) 24 MCG capsule Take 1 capsule (24 mcg total) by mouth 2 (two) times daily with a meal. 09/12/17   Trixie Dredge, PA-C  Multiple Vitamins tablet Take by mouth.    [provider]    Family History Family History  Problem Relation Age of Onset  . Allergies Mother   . Allergies Father   . Cancer Maternal Grandfather   . Cancer Paternal Grandmother   . Cancer Paternal Grandfather     Social History Social History   Tobacco Use  . Smoking status: Never Smoker  . Smokeless tobacco: Never Used  Substance Use Topics  . Alcohol use: No  . Drug use: No     Allergies   Patient has no known allergies.   Review of Systems Review of Systems  Constitutional: Positive for appetite change and fever. Negative for chills.  HENT: Positive for congestion, rhinorrhea and sore throat. Negative for ear pain, trouble swallowing and voice change.   Respiratory: Positive for cough. Negative for shortness of breath.   Cardiovascular: Negative for chest pain and palpitations.  Gastrointestinal: Negative for  abdominal pain, diarrhea, nausea and vomiting.  Musculoskeletal: Negative for arthralgias, back pain and myalgias.  Skin: Negative for rash.  Neurological: Positive for headaches. Negative for dizziness and light-headedness.     Physical Exam Triage Vital Signs ED Triage Vitals [11/17/17 1158]  Enc Vitals Group     BP 122/83     Pulse Rate (!) 125     Resp 16     Temp 99.4 F (37.4 C)     Temp Source Oral     SpO2 96 %     Weight 152 lb (68.9 kg)     Height 5\' 3"  (1.6 m)     Head  Circumference      Peak Flow      Pain Score      Pain Loc      Pain Edu?      Excl. in [place]?    No data found.  Updated Vital Signs BP 122/83 (BP Location: Right Arm)   Pulse (!) 121   Temp 99.4 F (37.4 C) (Oral)   Resp 16   Ht 5\' 3"  (1.6 m)   Wt 152 lb (68.9 kg)   LMP 11/17/2017   SpO2 96%   BMI 26.93 kg/m   Visual Acuity Right Eye Distance:   Left Eye Distance:   Bilateral Distance:    Right Eye Near:   Left Eye Near:    Bilateral Near:     Physical Exam  Constitutional: She is oriented to person, place, and time. She appears well-developed and well-nourished. No distress.  HENT:  Head: Normocephalic and atraumatic.  Right Ear: Tympanic membrane normal.  Left Ear: Tympanic membrane normal.  Nose: Mucosal edema present. Right sinus exhibits no maxillary sinus tenderness and no frontal sinus tenderness. Left sinus exhibits no maxillary sinus tenderness and no frontal sinus tenderness.  Mouth/Throat: Uvula is midline and mucous membranes are normal. Posterior oropharyngeal erythema present. No oropharyngeal exudate, posterior oropharyngeal edema or tonsillar abscesses.  Eyes: EOM are normal. Right eye exhibits no discharge. Left eye exhibits no discharge.  Neck: Normal range of motion. Neck supple.  Cardiovascular: Regular rhythm. Tachycardia present.  Pulmonary/Chest: Effort normal and breath sounds normal. No stridor. No respiratory distress. She has no wheezes. She has no rales.  Musculoskeletal: Normal range of motion.  Lymphadenopathy:    She has no cervical adenopathy.  Neurological: She is alert and oriented to person, place, and time.  Skin: Skin is warm and dry. She is not diaphoretic.  Psychiatric: She has a normal mood and affect. Her behavior is normal.  Nursing note and vitals reviewed.    UC Treatments / Results  Labs (all labs ordered are listed, but only abnormal results are displayed) Labs Reviewed - No data to display  EKG  EKG  Interpretation None       Radiology No results found.  Procedures Procedures (including critical care time)  Medications Ordered in UC Medications - No data to display   Initial Impression / Assessment and Plan / UC Course  I have reviewed the triage vital signs and the nursing notes.  Pertinent labs & imaging results that were available during my care of the patient were reviewed by me and considered in my medical decision making (see chart for details).     Hx of URI symptoms for 2 weeks, worsening cough accompanied with fever now.  Will cover for bacterial infection Home care instructions provided F/u with PCP in 1 week if not improving.   Final  Clinical Impressions(s) / UC Diagnoses   Final diagnoses:  Upper respiratory tract infection, unspecified type    ED Discharge Orders        Ordered    amoxicillin (AMOXIL) 500 MG capsule  3 times daily     11/17/17 1212       Controlled Substance Prescriptions [place] Controlled Substance Registry consulted? Not Applicable   Tyrell Antonio 11/17/17 1222

## 2017-11-17 NOTE — ED Triage Notes (Signed)
Patient's mother gives history of 2 weeks of coughing with low grade fever onset 2 days ago. Not eating well. Has been given OTCS for discomforts.

## 2017-11-21 ENCOUNTER — Emergency Department
Admission: EM | Admit: 2017-11-21 | Discharge: 2017-11-21 | Disposition: A | Discharge status: Home / Self Care | Payer: Commercial Managed Care - PPO | Source: Home / Self Care | Attending: Family Medicine | Admitting: Family Medicine

## 2017-11-21 ENCOUNTER — Emergency Department (INDEPENDENT_AMBULATORY_CARE_PROVIDER_SITE_OTHER): Payer: Commercial Managed Care - PPO

## 2017-11-21 DIAGNOSIS — J181 Lobar pneumonia, unspecified organism: Secondary | ICD-10-CM | POA: Diagnosis not present

## 2017-11-21 DIAGNOSIS — J189 Pneumonia, unspecified organism: Secondary | ICD-10-CM | POA: Diagnosis not present

## 2017-11-21 MED ORDER — AZITHROMYCIN 250 MG PO TABS: 250.0000 mg | ORAL_TABLET | Freq: Every day | ORAL | 0 refills | Status: DC

## 2017-11-21 MED ORDER — AEROCHAMBER PLUS W/MASK MISC: 2 refills | Status: DC

## 2017-11-21 MED ORDER — ALBUTEROL SULFATE HFA 108 (90 BASE) MCG/ACT IN AERS: 1.0000 | INHALATION_SPRAY | Freq: Four times a day (QID) | RESPIRATORY_TRACT | 0 refills | Status: DC | PRN

## 2017-11-21 NOTE — ED Provider Notes (Signed)
[facility]    CSN: [id number] Arrival date & time: 11/21/17  1059     History   Chief Complaint Chief Complaint  Patient presents with  . Cough  . Fever    HPI Belva Koziel is a 26 y.o. female.   HPI  Amritha Yorke is a 26 y.o. female with hx of Autism, accompanied by her mother with concern for pt not improving since starting on amoxicillin on 12/16 for cough that had been present for 2 weeks with associated low-grade fever. Pt has had continued fatigue, minimal productive cough and low grade fever. No n/v/d but decreased appetite. She has been able to keep down fluids.    Past Medical History:  Diagnosis Date  . Autism   . History of impacted cerumen     Patient Active Problem List   Diagnosis Date Noted  . Vitamin D deficiency 09/12/2017  . Change in stool caliber 09/12/2017  . Blood in stool 09/11/2017  . Malaise 09/11/2017  . Chronic idiopathic constipation 09/11/2017  . Generalized abdominal pain 09/11/2017  . Sleep trouble 12/26/2015  . Unspecified constipation 08/15/2014  . Urinary frequency 05/11/2014  . Nocturia 04/14/2014  . OAB (overactive bladder) 04/12/2014  . Frequent urination 04/12/2014  . PCOS (polycystic ovarian syndrome) 03/23/2014  . Elevated testosterone level in female 01/20/2014  . Global developmental delay 01/12/2014  . Autism 01/12/2014  . Adjustment disorder with anxious mood 01/12/2014  . Overbite 01/12/2014  . Menorrhagia with irregular cycle 12/06/2013    Past Surgical History:  Procedure Laterality Date  . TONSILLECTOMY AND ADENOIDECTOMY    . TYMPANOSTOMY TUBE PLACEMENT Bilateral     OB History    No data available       Home Medications    Prior to Admission medications   Medication Sig Start Date End Date Taking? Authorizing Provider  albuterol (PROVENTIL HFA;VENTOLIN HFA) 108 (90 Base) MCG/ACT inhaler Inhale 1-2 puffs into the lungs every 6 (six) hours as needed for wheezing or shortness of  breath. 11/21/17   Noe Gens, PA-C  amoxicillin (AMOXIL) 500 MG capsule Take 1 capsule (500 mg total) by mouth 3 (three) times daily. 11/17/17   Noe Gens, PA-C  azithromycin (ZITHROMAX) 250 MG tablet Take 1 tablet (250 mg total) by mouth daily. Take first 2 tablets together, then 1 every day until finished. 11/21/17   Noe Gens, PA-C  clindamycin-benzoyl peroxide (BENZACLIN) gel Apply topically 2 (two) times daily. For ten days. 08/23/17   Breeback, Jade L, PA-C  ipratropium (ATROVENT) 0.06 % nasal spray Place 1 spray into both nostrils 4 (four) times daily as needed for rhinitis. 12/14/16   Trixie Dredge, PA-C  lubiprostone [facility]) 24 MCG capsule Take 1 capsule (24 mcg total) by mouth 2 (two) times daily with a meal. 09/12/17   Trixie Dredge, PA-C  Multiple Vitamins tablet Take by mouth.    [provider]  Spacer/Aero-Holding Chambers (AEROCHAMBER PLUS WITH MASK) inhaler Use as instructed 11/21/17   Noe Gens, PA-C    Family History Family History  Problem Relation Age of Onset  . Allergies Mother   . Allergies Father   . Cancer Maternal Grandfather   . Cancer Paternal Grandmother   . Cancer Paternal Grandfather     Social History Social History   Tobacco Use  . Smoking status: Never Smoker  . Smokeless tobacco: Never Used  Substance Use Topics  . Alcohol use: No  . Drug use: No  Allergies   Patient has no known allergies.   Review of Systems Review of Systems  Constitutional: Positive for activity change, appetite change, fatigue and fever ("low grade"). Negative for chills.  HENT: Positive for congestion. Negative for ear pain, sore throat, trouble swallowing and voice change.   Respiratory: Positive for cough. Negative for shortness of breath.   Cardiovascular: Negative for chest pain and palpitations.  Gastrointestinal: Negative for abdominal pain, diarrhea, nausea and vomiting.  Musculoskeletal: Negative  for arthralgias, back pain and myalgias.  Skin: Negative for rash.     Physical Exam Triage Vital Signs ED Triage Vitals  Enc Vitals Group     BP 11/21/17 1120 107/74     Pulse Rate 11/21/17 1120 (!) 108     Resp --      Temp 11/21/17 1120 98.2 F (36.8 C)     Temp Source 11/21/17 1120 Oral     SpO2 11/21/17 1120 98 %     Weight 11/21/17 1120 150 lb (68 kg)     Height 11/21/17 1120 5\' 2"  (1.575 m)     Head Circumference --      Peak Flow --      Pain Score 11/21/17 1121 0     Pain Loc --      Pain Edu? --      Excl. in [place]? --    No data found.  Updated Vital Signs BP 107/74 (BP Location: Right Arm)   Pulse (!) 108   Temp 98.2 F (36.8 C) (Oral)   Ht 5\' 2"  (1.575 m)   Wt 150 lb (68 kg)   LMP 11/17/2017   SpO2 98%   BMI 27.44 kg/m   Visual Acuity Right Eye Distance:   Left Eye Distance:   Bilateral Distance:    Right Eye Near:   Left Eye Near:    Bilateral Near:     Physical Exam  Constitutional: She is oriented to person, place, and time. She appears well-developed and well-nourished. No distress.  HENT:  Head: Normocephalic and atraumatic.  Right Ear: Tympanic membrane normal.  Left Ear: Tympanic membrane normal.  Nose: Nose normal.  Mouth/Throat: Uvula is midline, oropharynx is clear and moist and mucous membranes are normal.  Eyes: EOM are normal.  Neck: Normal range of motion. Neck supple.  Cardiovascular: Tachycardia present.  Pulmonary/Chest: Effort normal. No stridor. No respiratory distress. She has decreased breath sounds in the right lower field and the left lower field. She has no wheezes. She has rhonchi in the right upper field. She has no rales.  Musculoskeletal: Normal range of motion.  Lymphadenopathy:    She has no cervical adenopathy.  Neurological: She is alert and oriented to person, place, and time.  Skin: Skin is warm and dry. She is not diaphoretic.  Psychiatric: She has a normal mood and affect. Her behavior is normal.  Nursing  note and vitals reviewed.    UC Treatments / Results  Labs (all labs ordered are listed, but only abnormal results are displayed) Labs Reviewed - No data to display  EKG  EKG Interpretation None       Radiology Dg Chest 2 View  Result Date: 11/21/2017 CLINICAL DATA:  26 year old female with progressive cough and fever despite 1 week of amoxicillin. EXAM: CHEST  2 VIEW COMPARISON:  Abdominal radiographs 09/11/2017. FINDINGS: Left lung base consolidation, appears mostly related to the left lower lobe on the lateral view. Small left pleural effusion. The left upper lobe is spared. The  right lung appears clear. Mediastinal contours are within normal limits. Visualized tracheal air column is within normal limits. Negative visible bowel gas pattern. No acute osseous abnormality identified. IMPRESSION: Left lung base Pneumonia, possibly multi-lobar, and with a small associated pleural effusion. Electronically Signed   By: Genevie Ann M.D.   On: 11/21/2017 11:58    Procedures Procedures (including critical care time)  Medications Ordered in UC Medications - No data to display   Initial Impression / Assessment and Plan / UC Course  I have reviewed the triage vital signs and the nursing notes.  Pertinent labs & imaging results that were available during my care of the patient were reviewed by me and considered in my medical decision making (see chart for details).     CXR c/w LLL pneumonia Pt has 2 days left of amoxicillin. May continue to take. Mother was hesitant for pt to try Azithromycin last time due to her own experiences with palpitations but pt's father has taken azithromycin w/o reaction. Agreeable to try Azithromycin today.  Mother requested incentive spirometer to help encourage pt to cough up the mucous.  Will also prescribe albuterol inhaler with spacer. May try if incentive spirometer not helping.  F/u with PCP in 1 week if needed Discussed symptoms that warrant emergent  care in the ED.    Final Clinical Impressions(s) / UC Diagnoses   Final diagnoses:  Community acquired pneumonia of left lower lobe of lung [facility])    ED Discharge Orders        Ordered    azithromycin (ZITHROMAX) 250 MG tablet  Daily     11/21/17 1215    albuterol (PROVENTIL HFA;VENTOLIN HFA) 108 (90 Base) MCG/ACT inhaler  Every 6 hours PRN     11/21/17 1215    Spacer/Aero-Holding Chambers (AEROCHAMBER PLUS WITH MASK) inhaler     11/21/17 1215       Controlled Substance Prescriptions [place] Controlled Substance Registry consulted? Not Applicable   Tyrell Antonio 11/21/17 1343

## 2017-11-21 NOTE — Discharge Instructions (Signed)
°  You may finish the last 2 days of prescribed amoxicillin but start giving the Azithromycin today as prescribed.  You may try the incentive spirometer breathing exercises to encourage her to cough up mucous.  If you feel her cough is worsening, wheezing or she is having trouble breathing, you may also try the albuterol inhaler every 6 hours.  Using the spacer with the inhaler will help her breath in as much medication as possible.    If she continues to worsen- trouble breathing, not able to keep down fluids, difficulty waking her, please call 911 or go to closest emergency department for further evaluation and treatment.

## 2017-11-21 NOTE — ED Triage Notes (Signed)
Pt was seen here on Sunday, and since, fever has not resolved, and mom said the cough has became much worse.

## 2017-11-22 ENCOUNTER — Telehealth: Payer: Self-pay | Admitting: Emergency Medicine

## 2017-11-22 NOTE — Telephone Encounter (Signed)
Patient's mother states daughter is getting upset GI being on 2 antibiotics. Per consultation with Dr.Beese: she may stop the Amoxicillin; make sure eating a little food before each dose of azithromycin.

## 2017-12-11 ENCOUNTER — Ambulatory Visit: Payer: Commercial Managed Care - PPO | Admitting: Physician Assistant

## 2017-12-11 ENCOUNTER — Encounter: Payer: Self-pay | Admitting: Physician Assistant

## 2017-12-11 VITALS — BP 108/61 | HR 95 | Ht 62.0 in | Wt 154.0 lb

## 2017-12-11 DIAGNOSIS — H6063 Unspecified chronic otitis externa, bilateral: Secondary | ICD-10-CM | POA: Diagnosis not present

## 2017-12-11 DIAGNOSIS — H6123 Impacted cerumen, bilateral: Secondary | ICD-10-CM | POA: Diagnosis not present

## 2017-12-11 DIAGNOSIS — J181 Lobar pneumonia, unspecified organism: Secondary | ICD-10-CM | POA: Diagnosis not present

## 2017-12-11 DIAGNOSIS — J189 Pneumonia, unspecified organism: Secondary | ICD-10-CM

## 2017-12-11 MED ORDER — OFLOXACIN 0.3 % OT SOLN: 5.0000 [drp] | Freq: Every day | OTIC | 0 refills | Status: DC

## 2017-12-11 NOTE — Progress Notes (Signed)
Subjective:    Patient ID: Crystal Cline, female    DOB: [birth year], 27 y.o.   MRN: [id number]  HPI Pt is a 27 yo female with autusim with global delay who presents to the clinic with her mother. She is here to follow up on CAP from UC visit on 11/21/17. She was given augmentin but had to switch to zpak. She is doing much better. A little dry cough is present. No SOB, wheezing, fatigue, fever, chills, body aches. She has finished zpak.   .. Active Ambulatory Problems    Diagnosis Date Noted  . Menorrhagia with irregular cycle 12/06/2013  . Global developmental delay 01/12/2014  . Autism 01/12/2014  . Adjustment disorder with anxious mood 01/12/2014  . Overbite 01/12/2014  . Elevated testosterone level in female 01/20/2014  . PCOS (polycystic ovarian syndrome) 03/23/2014  . OAB (overactive bladder) 04/12/2014  . Frequent urination 04/12/2014  . Nocturia 04/14/2014  . Urinary frequency 05/11/2014  . Unspecified constipation 08/15/2014  . Sleep trouble 12/26/2015  . Blood in stool 09/11/2017  . Malaise 09/11/2017  . Chronic idiopathic constipation 09/11/2017  . Generalized abdominal pain 09/11/2017  . Vitamin D deficiency 09/12/2017  . Change in stool caliber 09/12/2017   Resolved Ambulatory Problems    Diagnosis Date Noted  . Impacted cerumen of both ears 06/27/2013  . Candidiasis of vulva 06/27/2013  . Routine gynecological examination 12/06/2013  . Screening for malignant neoplasm of the cervix 12/06/2013   Past Medical History:  Diagnosis Date  . Autism   . History of impacted cerumen          Review of Systems  All other systems reviewed and are negative.      Objective:   Physical Exam  Constitutional: She is oriented to person, place, and time. She appears well-developed and well-nourished.  HENT:  Head: Normocephalic and atraumatic.  Nose: Nose normal.  Mouth/Throat: Oropharynx is clear and moist. No oropharyngeal exudate.  Bilateral cerumen  impaction. Nurse irrigated and I came behind a scooped cerumen out of canal. External ear appears red and purulent drainage. Tender to advancement of speculum.   Eyes: Conjunctivae are normal.  Neck: Normal range of motion. Neck supple.  Cardiovascular: Normal rate, regular rhythm and normal heart sounds.  Pulmonary/Chest: Effort normal and breath sounds normal. She has no wheezes.  Lymphadenopathy:    She has no cervical adenopathy.  Neurological: She is alert and oriented to person, place, and time.  Psychiatric: She has a normal mood and affect. Her behavior is normal.          Assessment & Plan:  Marland KitchenMarland KitchenDiagnoses and all orders for this visit:  Community acquired pneumonia of left lower lobe of lung ([place])  Bilateral impacted cerumen  Chronic non-infective otitis externa of both ears, unspecified type -     ofloxacin (FLOXIN) 0.3 % OTIC solution; Place 5 drops into both ears daily. For 7 days.   Reassurance given pneumonia has seemed to clear.   Marland Kitchen.Indication: Cerumen impaction of the ear(s)  Medical necessity statement: On physical examination, cerumen impairs clinically significant portions of the external auditory canal, and tympanic membrane. Noted obstructive, copious cerumen that cannot be removed without magnification and instrumentations requiring physician skills Consent: Discussed benefits and risks of procedure and verbal consent obtained Procedure: Patient was prepped for the procedure. Utilized an otoscope to assess and take note of the ear canal, the tympanic membrane, and the presence, amount, and placement of the cerumen. Gentle water irrigation and soft plastic  curette was utilized to remove cerumen.  Post procedure examination: shows cerumen was completely removed. Patient tolerated procedure well. The patient is made aware that they may experience temporary vertigo, temporary hearing loss, and temporary discomfort. If these symptom last for more than 24 hours to call  the clinic or proceed to the ED.  Concern for otitis externa. Treated with abx drops for 7 days.

## 2018-06-20 LAB — HM COLONOSCOPY

## 2018-06-23 IMAGING — DX DG ABDOMEN 1V
2 series · 2 of 2 positions shown · non-contrast
Comparison: None.

CLINICAL DATA: Generalized abdominal pain. History of constipation.

EXAM:
ABDOMEN - 1 VIEW

[abdomen kub (1 of 2)]
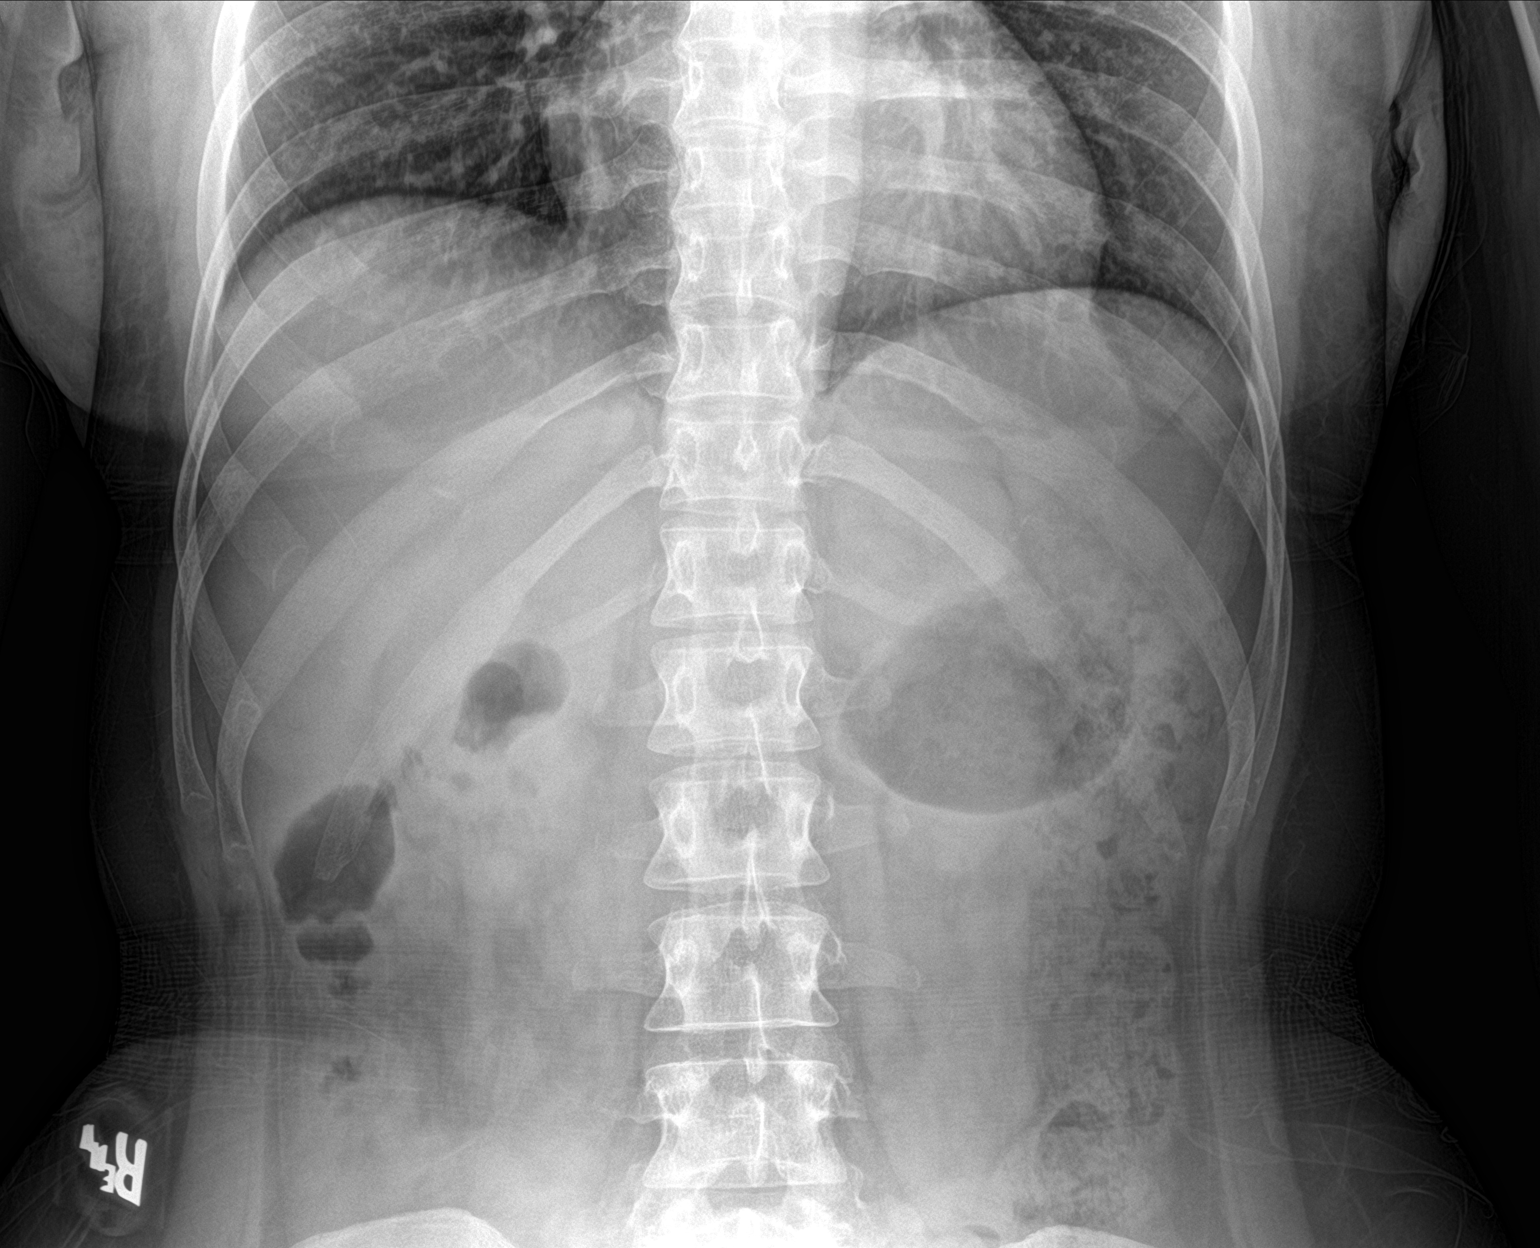

[abdomen kub (2 of 2)]
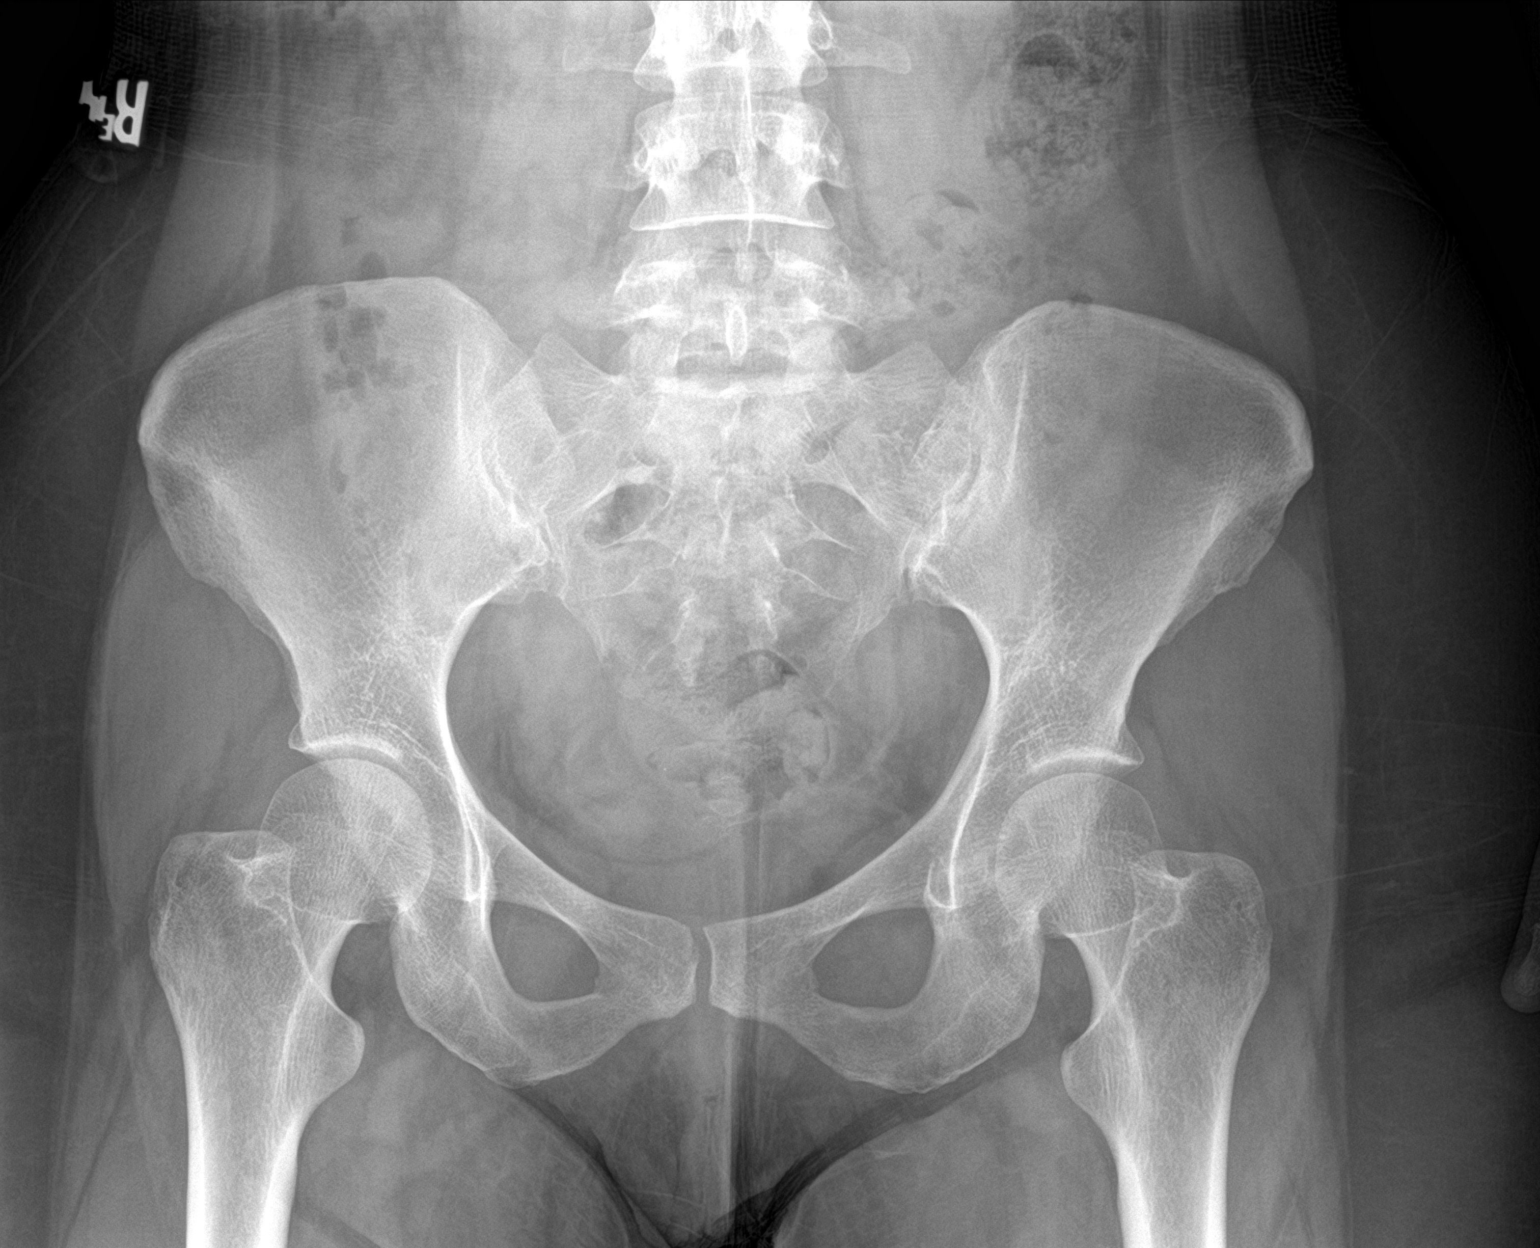

[2 of 2 positions shown; findings below may reference images not displayed]

FINDINGS: No excessive stool retention. Stool is mainly seen within the
descending colon and sigmoid. There is no rectal impaction. No
concerning mass effect or gas collection. No abnormal calcification.
Lung bases are clear. No osseous findings.
IMPRESSION: Normal bowel gas pattern. No excessive stool retention or rectal
impaction.

## 2018-08-07 ENCOUNTER — Encounter: Payer: Self-pay | Admitting: Physician Assistant

## 2020-11-30 ENCOUNTER — Emergency Department (INDEPENDENT_AMBULATORY_CARE_PROVIDER_SITE_OTHER)
Admission: EM | Admit: 2020-11-30 | Discharge: 2020-11-30 | Disposition: A | Discharge status: Home / Self Care | Payer: Commercial Managed Care - PPO | Source: Home / Self Care

## 2020-11-30 DIAGNOSIS — J0100 Acute maxillary sinusitis, unspecified: Secondary | ICD-10-CM

## 2020-11-30 DIAGNOSIS — R059 Cough, unspecified: Secondary | ICD-10-CM

## 2020-11-30 MED ORDER — AMOXICILLIN 250 MG/5ML PO SUSR: 500.0000 mg | Freq: Two times a day (BID) | ORAL | 0 refills | Status: DC

## 2020-11-30 MED ORDER — AMOXICILLIN 875 MG PO TABS: 875.0000 mg | ORAL_TABLET | Freq: Two times a day (BID) | ORAL | 0 refills | Status: DC

## 2020-11-30 NOTE — ED Provider Notes (Signed)
[facility]    CSN: [id number] Arrival date & time: 11/30/20  0946      History   Chief Complaint Chief Complaint  Patient presents with  . Nasal Congestion  . Cough    HPI Crystal Cline is a 29 y.o. female.   Established [place] patient  Pt here today with mom (pt is special needs) c/o cough and congestion x 8 days. Siblings visited from [place] who were all sick. Has not been vaccinated for covid.     Past Medical History:  Diagnosis Date  . Autism   . History of impacted cerumen     Patient Active Problem List   Diagnosis Date Noted  . Vitamin D deficiency 09/12/2017  . Change in stool caliber 09/12/2017  . Blood in stool 09/11/2017  . Malaise 09/11/2017  . Chronic idiopathic constipation 09/11/2017  . Generalized abdominal pain 09/11/2017  . Sleep trouble 12/26/2015  . Unspecified constipation 08/15/2014  . Urinary frequency 05/11/2014  . Nocturia 04/14/2014  . OAB (overactive bladder) 04/12/2014  . Frequent urination 04/12/2014  . PCOS (polycystic ovarian syndrome) 03/23/2014  . Elevated testosterone level in female 01/20/2014  . Global developmental delay 01/12/2014  . Autism 01/12/2014  . Adjustment disorder with anxious mood 01/12/2014  . Overbite 01/12/2014  . Menorrhagia with irregular cycle 12/06/2013    Past Surgical History:  Procedure Laterality Date  . TONSILLECTOMY AND ADENOIDECTOMY    . TYMPANOSTOMY TUBE PLACEMENT Bilateral     OB History   No obstetric history on file.      Home Medications    Prior to Admission medications   Medication Sig Start Date End Date Taking? Authorizing Provider  amoxicillin (AMOXIL) 875 MG tablet Take 1 tablet (875 mg total) by mouth 2 (two) times daily. Take with food 11/30/20  Yes Robyn Haber, MD  ipratropium (ATROVENT) 0.06 % nasal spray Place 1 spray into both nostrils 4 (four) times daily as needed for rhinitis. 12/14/16   Trixie Dredge, PA-C  lubiprostone  [facility]) 24 MCG capsule Take 1 capsule (24 mcg total) by mouth 2 (two) times daily with a meal. 09/12/17   Trixie Dredge, PA-C  Multiple Vitamins tablet Take by mouth.    [provider]  ofloxacin (FLOXIN) 0.3 % OTIC solution Place 5 drops into both ears daily. For 7 days. 12/11/17   Donella Stade, PA-C  Spacer/Aero-Holding Chambers (AEROCHAMBER PLUS WITH MASK) inhaler Use as instructed 11/21/17   Noe Gens, PA-C    Family History Family History  Problem Relation Age of Onset  . Allergies Mother   . Allergies Father   . Cancer Maternal Grandfather   . Cancer Paternal Grandmother   . Cancer Paternal Grandfather     Social History Social History   Tobacco Use  . Smoking status: Never Smoker  . Smokeless tobacco: Never Used  Substance Use Topics  . Alcohol use: No  . Drug use: No     Allergies   Patient has no known allergies.   Review of Systems Review of Systems   Physical Exam Triage Vital Signs ED Triage Vitals [11/30/20 1001]  Enc Vitals Group     BP 113/80     Pulse Rate (!) 104     Resp 17     Temp 98.3 F (36.8 C)     Temp Source Oral     SpO2 97 %     Weight      Height  Head Circumference      Peak Flow      Pain Score      Pain Loc      Pain Edu?      Excl. in GC?    No data found.  Updated Vital Signs BP 113/80 (BP Location: Right Arm)   Pulse (!) 104   Temp 98.3 F (36.8 C) (Oral)   Resp 17   SpO2 97%    Physical Exam Vitals and nursing note reviewed.  Constitutional:      General: She is not in acute distress.    Appearance: Normal appearance. She is normal weight. She is not ill-appearing.  HENT:     Head: Normocephalic.     Right Ear: Tympanic membrane normal.     Left Ear: Tympanic membrane normal.     Nose: Congestion present.     Mouth/Throat:     Mouth: Mucous membranes are moist.     Pharynx: Oropharynx is clear.  Eyes:     Conjunctiva/sclera: Conjunctivae normal.  Cardiovascular:      Rate and Rhythm: Normal rate.  Pulmonary:     Effort: Pulmonary effort is normal.     Breath sounds: Normal breath sounds.  Musculoskeletal:        General: Normal range of motion.     Cervical back: Normal range of motion and neck supple.  Skin:    General: Skin is warm and dry.  Neurological:     General: No focal deficit present.     Mental Status: She is alert.  Psychiatric:     Comments: autistic      UC Treatments / Results  Labs (all labs ordered are listed, but only abnormal results are displayed) Labs Reviewed - No data to display  EKG   Radiology No results found.  Procedures Procedures (including critical care time)  Medications Ordered in UC Medications - No data to display  Initial Impression / Assessment and Plan / UC Course  I have reviewed the triage vital signs and the nursing notes.  Pertinent labs & imaging results that were available during my care of the patient were reviewed by me and considered in my medical decision making (see chart for details).    Final Clinical Impressions(s) / UC Diagnoses   Final diagnoses:  Acute non-recurrent maxillary sinusitis   Discharge Instructions   None    ED Prescriptions    Medication Sig Dispense Auth. Provider   amoxicillin (AMOXIL) 875 MG tablet Take 1 tablet (875 mg total) by mouth 2 (two) times daily. Take with food 20 tablet Elvina Sidle, MD     I have reviewed the PDMP during this encounter.   Elvina Sidle, MD 11/30/20 1028

## 2020-11-30 NOTE — ED Triage Notes (Signed)
Pt here today with mom (pt is special needs) c/o cough and congestion x 8 days. Siblings visited from [place] who were all sick. Has not been vaccinated for covid.

## 2020-12-02 LAB — COVID-19, FLU A+B AND RSV
Influenza A, NAA: NOT DETECTED
Influenza B, NAA: NOT DETECTED
RSV, NAA: NOT DETECTED
SARS-CoV-2, NAA: NOT DETECTED

## 2020-12-07 ENCOUNTER — Ambulatory Visit (INDEPENDENT_AMBULATORY_CARE_PROVIDER_SITE_OTHER): Payer: Commercial Managed Care - PPO | Admitting: Physician Assistant

## 2020-12-07 ENCOUNTER — Encounter: Payer: Self-pay | Admitting: Physician Assistant

## 2020-12-07 ENCOUNTER — Other Ambulatory Visit: Payer: Self-pay

## 2020-12-07 VITALS — BP 117/74 | HR 99 | Wt 147.2 lb

## 2020-12-07 DIAGNOSIS — Z803 Family history of malignant neoplasm of breast: Secondary | ICD-10-CM | POA: Insufficient documentation

## 2020-12-07 DIAGNOSIS — Z0000 Encounter for general adult medical examination without abnormal findings: Secondary | ICD-10-CM

## 2020-12-07 DIAGNOSIS — R140 Abdominal distension (gaseous): Secondary | ICD-10-CM | POA: Diagnosis not present

## 2020-12-07 DIAGNOSIS — Z13220 Encounter for screening for lipoid disorders: Secondary | ICD-10-CM

## 2020-12-07 DIAGNOSIS — K5904 Chronic idiopathic constipation: Secondary | ICD-10-CM

## 2020-12-07 DIAGNOSIS — Z8041 Family history of malignant neoplasm of ovary: Secondary | ICD-10-CM

## 2020-12-07 DIAGNOSIS — Z131 Encounter for screening for diabetes mellitus: Secondary | ICD-10-CM

## 2020-12-07 LAB — LIPID PANEL W/REFLEX DIRECT LDL
Cholesterol: 150 mg/dL (ref <200)
HDL: 48 mg/dL — ABNORMAL LOW (ref >=50)

## 2020-12-07 LAB — COMPLETE METABOLIC PANEL WITH GFR
Chloride: 104 mmol/L (ref 98–110)
Creat: 0.58 mg/dL (ref 0.50–1.10)
GFR, Est African American: 142 mL/min/{1.73_m2} (ref >=60)

## 2020-12-07 NOTE — Patient Instructions (Addendum)
Probiotic for constipation  Schedule pap smear.    Constipation, Adult Constipation is when a person:  Poops (has a bowel movement) fewer times in a week than normal.  Has a hard time pooping.  Has poop that is dry, hard, or bigger than normal. Follow these instructions at home: Eating and drinking   Eat foods that have a lot of fiber, such as: ? Fresh fruits and vegetables. ? Whole grains. ? Beans.  Eat less of foods that are high in fat, low in fiber, or overly processed, such as: ? [place] fries. ? Hamburgers. ? Cookies. ? Candy. ? Soda.  Drink enough fluid to keep your pee (urine) clear or pale yellow. General instructions  Exercise regularly or as told by your doctor.  Go to the restroom when you feel like you need to poop. Do not hold it in.  Take over-the-counter and prescription medicines only as told by your doctor. These include any fiber supplements.  Do pelvic floor retraining exercises, such as: ? Doing deep breathing while relaxing your lower belly (abdomen). ? Relaxing your pelvic floor while pooping.  Watch your condition for any changes.  Keep all follow-up visits as told by your doctor. This is important. Contact a doctor if:  You have pain that gets worse.  You have a fever.  You have not pooped for 4 days.  You throw up (vomit).  You are not hungry.  You lose weight.  You are bleeding from the anus.  You have thin, pencil-like poop (stool). Get help right away if:  You have a fever, and your symptoms suddenly get worse.  You leak poop or have blood in your poop.  Your belly feels hard or bigger than normal (is bloated).  You have very bad belly pain.  You feel dizzy or you faint. This information is not intended to replace advice given to you by your health care provider. Make sure you discuss any questions you have with your health care provider. Document Revised: 11/01/2017 Document Reviewed: 05/09/2016 Elsevier Patient  Education  2020 [place] Maintenance, Female Adopting a healthy lifestyle and getting preventive care are important in promoting health and wellness. Ask your health care provider about:  The right schedule for you to have regular tests and exams.  Things you can do on your own to prevent diseases and keep yourself healthy. What should I know about diet, weight, and exercise? Eat a healthy diet   Eat a diet that includes plenty of vegetables, fruits, low-fat dairy products, and lean protein.  Do not eat a lot of foods that are high in solid fats, added sugars, or sodium. Maintain a healthy weight Body mass index (BMI) is used to identify weight problems. It estimates body fat based on height and weight. Your health care provider can help determine your BMI and help you achieve or maintain a healthy weight. Get regular exercise Get regular exercise. This is one of the most important things you can do for your health. Most adults should:  Exercise for at least 150 minutes each week. The exercise should increase your heart rate and make you sweat (moderate-intensity exercise).  Do strengthening exercises at least twice a week. This is in addition to the moderate-intensity exercise.  Spend less time sitting. Even light physical activity can be beneficial. Watch cholesterol and blood lipids Have your blood tested for lipids and cholesterol at 32 years of age, then have this test every 5 years. Have your cholesterol  levels checked more often if:  Your lipid or cholesterol levels are high.  You are older than 32 years of age.  You are at high risk for heart disease. What should I know about cancer screening? Depending on your health history and family history, you may need to have cancer screening at various ages. This may include screening for:  Breast cancer.  Cervical cancer.  Colorectal cancer.  Skin cancer.  Lung cancer. What should I know about heart  disease, diabetes, and high blood pressure? Blood pressure and heart disease  High blood pressure causes heart disease and increases the risk of stroke. This is more likely to develop in people who have high blood pressure readings, are of African descent, or are overweight.  Have your blood pressure checked: ? Every 3-5 years if you are 32-43 years of age. ? Every year if you are 18 years old or older. Diabetes Have regular diabetes screenings. This checks your fasting blood sugar level. Have the screening done:  Once every three years after age 89 if you are at a normal weight and have a low risk for diabetes.  More often and at a younger age if you are overweight or have a high risk for diabetes. What should I know about preventing infection? Hepatitis B If you have a higher risk for hepatitis B, you should be screened for this virus. Talk with your health care provider to find out if you are at risk for hepatitis B infection. Hepatitis C Testing is recommended for:  Everyone born from [birth year] through 1967.  Anyone with known risk factors for hepatitis C. Sexually transmitted infections (STIs)  Get screened for STIs, including gonorrhea and chlamydia, if: ? You are sexually active and are younger than 32 years of age. ? You are older than 32 years of age and your health care provider tells you that you are at risk for this type of infection. ? Your sexual activity has changed since you were last screened, and you are at increased risk for chlamydia or gonorrhea. Ask your health care provider if you are at risk.  Ask your health care provider about whether you are at high risk for HIV. Your health care provider may recommend a prescription medicine to help prevent HIV infection. If you choose to take medicine to prevent HIV, you should first get tested for HIV. You should then be tested every 3 months for as long as you are taking the medicine. Pregnancy  If you are about to stop  having your period (premenopausal) and you may become pregnant, seek counseling before you get pregnant.  Take 400 to 800 micrograms (mcg) of folic acid every day if you become pregnant.  Ask for birth control (contraception) if you want to prevent pregnancy. Osteoporosis and menopause Osteoporosis is a disease in which the bones lose minerals and strength with aging. This can result in bone fractures. If you are 19 years old or older, or if you are at risk for osteoporosis and fractures, ask your health care provider if you should:  Be screened for bone loss.  Take a calcium or vitamin D supplement to lower your risk of fractures.  Be given hormone replacement therapy (HRT) to treat symptoms of menopause. Follow these instructions at home: Lifestyle  Do not use any products that contain nicotine or tobacco, such as cigarettes, e-cigarettes, and chewing tobacco. If you need help quitting, ask your health care provider.  Do not use street drugs.  Do not  share needles.  Ask your health care provider for help if you need support or information about quitting drugs. Alcohol use  Do not drink alcohol if: ? Your health care provider tells you not to drink. ? You are pregnant, may be pregnant, or are planning to become pregnant.  If you drink alcohol: ? Limit how much you use to 0-1 drink a day. ? Limit intake if you are breastfeeding.  Be aware of how much alcohol is in your drink. In the [place] one drink equals one 12 oz bottle of beer (355 mL), one 5 oz glass of wine (148 mL), or one 1 oz glass of hard liquor (44 mL). General instructions  Schedule regular health, dental, and eye exams.  Stay current with your vaccines.  Tell your health care provider if: ? You often feel depressed. ? You have ever been abused or do not feel safe at home. Summary  Adopting a healthy lifestyle and getting preventive care are important in promoting health and wellness.  Follow your health  care provider's instructions about healthy diet, exercising, and getting tested or screened for diseases.  Follow your health care provider's instructions on monitoring your cholesterol and blood pressure. This information is not intended to replace advice given to you by your health care provider. Make sure you discuss any questions you have with your health care provider. Document Revised: 11/12/2018 Document Reviewed: 11/12/2018 Elsevier Patient Education  2020 [employer].

## 2020-12-07 NOTE — Progress Notes (Signed)
Subjective:     Crystal Cline is a 30 y.o. female and is here for a comprehensive physical exam. The patient has autism and Global developmental delay The patient reports see below.   Mother accompanies patient and speaks for her.   Mother concerned about ovarian cancer. pts paternal aunt died of ovarian cancer in 48s.  Paternal GM 25's died of BC.  Mother states patient does c/o a lot of about abdominal pain and fullness. Pt does have chronic constipation issues as well. [place] 2015 showed thickened endometrium. Having periods that are heavy. Not sexual active and never been.    Social History   Socioeconomic History   Marital status: Single    Spouse name: Not on file   Number of children: Not on file   Years of education: Not on file   Highest education level: Not on file  Occupational History   Not on file  Tobacco Use   Smoking status: Never Smoker   Smokeless tobacco: Never Used  Substance and Sexual Activity   Alcohol use: No   Drug use: No   Sexual activity: Never  Other Topics Concern   Not on file  Social History Narrative   Marital Status: Single    Children:  None    Pets: None    Living Situation: Lives with parents Crystal Cline and Crystal Cline)    Education:  She was home schooled due to her special needs (Autism)    Tobacco Use/Exposure:  None    Alcohol Use: None   Drug Use:  None   Diet:  Regular   Exercise:  Bicycle  (6 miles daily- 6 x per week)     Hobbies: Puzzles and Gardening             Social Determinants of Radio broadcast assistant Strain: Not on file  Food Insecurity: Not on file  Transportation Needs: Not on file  Physical Activity: Not on file  Stress: Not on file  Social Connections: Not on file  Intimate Partner Violence: Not on file   Health Maintenance  Topic Date Due   Hepatitis C Screening  Never done   COVID-19 Vaccine (1) Never done   HIV Screening  Never done   TETANUS/TDAP  Never done   PAP SMEAR-Modifier   10/05/2016   INFLUENZA VACCINE  Never done    The following portions of the patient's history were reviewed and updated as appropriate: allergies, current medications, past family history, past medical history, past social history, past surgical history and problem list.  Review of Systems A comprehensive review of systems was negative.   Objective:    BP 117/74    Pulse 99    Wt 147 lb 3.2 oz (66.8 kg)    SpO2 98%    BMI 26.92 kg/m  General appearance: alert, cooperative and appears stated age Head: Normocephalic, without obvious abnormality, atraumatic Eyes: conjunctivae/corneas clear. PERRL, EOM's intact. Fundi benign. Ears: normal TM's and external ear canals both ears Nose: Nares normal. Septum midline. Mucosa normal. No drainage or sinus tenderness. Throat: lips, mucosa, and tongue normal; teeth and gums normal Neck: no adenopathy, no carotid bruit, no JVD, supple, symmetrical, trachea midline and thyroid not enlarged, symmetric, no tenderness/mass/nodules Back: symmetric, no curvature. ROM normal. No CVA tenderness. Lungs: clear to auscultation bilaterally Breasts: normal appearance, no masses or tenderness Heart: regular rate and rhythm, S1, S2 normal, no murmur, click, rub or gallop Abdomen: soft, non-tender; bowel sounds normal; no masses,  no organomegaly  Extremities: extremities normal, atraumatic, no cyanosis or edema Pulses: 2+ and symmetric Skin: Skin color, texture, turgor normal. No rashes or lesions Lymph nodes: Cervical, supraclavicular, and axillary nodes normal. Neurologic: Alert and oriented X 3, normal strength and tone. Normal symmetric reflexes. Normal coordination and gait    Assessment:    Healthy female exam.      Plan:  Marland KitchenMarland KitchenDiagnoses and all orders for this visit:  Routine physical examination -     US Pelvic Complete With Transvaginal -     CA 125 -     COMPLETE METABOLIC PANEL WITH GFR -     Lipid Panel w/reflex Direct LDL -     TSH  Family  history of ovarian cancer -     US Pelvic Complete With Transvaginal -     CA 125  Family history of breast cancer  Bloating -     US Pelvic Complete With Transvaginal -     CA 125 -     TSH  Screening for diabetes mellitus -     COMPLETE METABOLIC PANEL WITH GFR  Screening for lipid disorders -     Lipid Panel w/reflex Direct LDL  Chronic idiopathic constipation   .Marland Kitchen Discussed 150 minutes of exercise a week.  Encouraged vitamin D 1000 units and Calcium 1300mg  or 4 servings of dairy a day.  Last pap 2014. Never been sexually active low risk of cervical cancer. Mother does want to have this done but not today.  Is concern for ovarian cancer due to family history. She is having non-specific symptoms and could be related to constipation.  Ordered CA125 and pelvic ultrasound.  Discussed constipation treatment.  Not due for mammogram. Family hx of BC even 10 years earlier would be 40.  Mother declined covid/flu.  Agreed to tdap but inadvertently not done. Will get done when patient follow ups for pap.     See After Visit Summary for Counseling Recommendations

## 2020-12-08 LAB — COMPLETE METABOLIC PANEL WITH GFR
AG Ratio: 1.5 (calc) (ref 1.0–2.5)
ALT: 9 U/L (ref 6–29)
AST: 12 U/L (ref 10–30)
Albumin: 4 g/dL (ref 3.6–5.1)
Alkaline phosphatase (APISO): 46 U/L (ref 31–125)
BUN: 8 mg/dL (ref 7–25)
CO2: 26 mmol/L (ref 20–32)
Calcium: 9.3 mg/dL (ref 8.6–10.2)
GFR, Est Non African American: 123 mL/min/{1.73_m2} (ref >=60)
Globulin: 2.6 g/dL (calc) (ref 1.9–3.7)
Glucose, Bld: 90 mg/dL (ref 65–99)
Potassium: 4.1 mmol/L (ref 3.5–5.3)
Sodium: 138 mmol/L (ref 135–146)
Total Bilirubin: 0.3 mg/dL (ref 0.2–1.2)
Total Protein: 6.6 g/dL (ref 6.1–8.1)

## 2020-12-08 LAB — CA 125: CA 125: 12 U/mL (ref <35)

## 2020-12-08 LAB — LIPID PANEL W/REFLEX DIRECT LDL
LDL Cholesterol (Calc): 83 mg/dL (calc)
Non-HDL Cholesterol (Calc): 102 mg/dL (calc) (ref <130)
Total CHOL/HDL Ratio: 3.1 (calc) (ref <5.0)
Triglycerides: 93 mg/dL (ref <150)

## 2020-12-08 LAB — TSH: TSH: 0.58 mIU/L

## 2020-12-08 NOTE — Progress Notes (Signed)
Labs look great.  CA125 still pending.

## 2020-12-09 ENCOUNTER — Encounter: Payer: Self-pay | Admitting: Physician Assistant

## 2020-12-09 NOTE — Progress Notes (Signed)
GREAT news CA125 normal for your ovarian cancer screening.

## 2020-12-14 ENCOUNTER — Ambulatory Visit (INDEPENDENT_AMBULATORY_CARE_PROVIDER_SITE_OTHER): Payer: Commercial Managed Care - PPO

## 2020-12-14 ENCOUNTER — Other Ambulatory Visit: Payer: Commercial Managed Care - PPO

## 2020-12-14 ENCOUNTER — Other Ambulatory Visit: Payer: Self-pay

## 2020-12-14 DIAGNOSIS — D259 Leiomyoma of uterus, unspecified: Secondary | ICD-10-CM | POA: Diagnosis not present

## 2020-12-14 NOTE — Progress Notes (Signed)
No worrisome findings. Tiny uterine fibroid. Both ovaries normal.

## 2020-12-16 ENCOUNTER — Other Ambulatory Visit (HOSPITAL_COMMUNITY)
Admission: RE | Admit: 2020-12-16 | Discharge: 2020-12-16 | Disposition: A | Discharge status: Home / Self Care | Payer: Commercial Managed Care - PPO | Source: Ambulatory Visit | Attending: Physician Assistant | Admitting: Physician Assistant

## 2020-12-16 ENCOUNTER — Encounter: Payer: Self-pay | Admitting: Physician Assistant

## 2020-12-16 ENCOUNTER — Ambulatory Visit (INDEPENDENT_AMBULATORY_CARE_PROVIDER_SITE_OTHER): Payer: Commercial Managed Care - PPO | Admitting: Physician Assistant

## 2020-12-16 ENCOUNTER — Other Ambulatory Visit: Payer: Self-pay

## 2020-12-16 VITALS — BP 111/64 | HR 85 | Wt 147.1 lb

## 2020-12-16 DIAGNOSIS — Z01419 Encounter for gynecological examination (general) (routine) without abnormal findings: Secondary | ICD-10-CM | POA: Insufficient documentation

## 2020-12-16 NOTE — Progress Notes (Signed)
Subjective:     Crystal Cline is a 30 y.o. woman who comes in today for a  pap smear only. Her most recent annual exam was on 12/07/2020. Her most recent Pap smear was on 10/2013 and showed no abnormalities. Previous abnormal Pap smears: no. Contraception: abstinence  The following portions of the patient's history were reviewed and updated as appropriate: allergies, current medications, past family history, past medical history, past social history, past surgical history and problem list.  Review of Systems A comprehensive review of systems was negative.   Objective:    BP 111/64   Pulse 85   Wt 147 lb 1.3 oz (66.7 kg)   SpO2 99%   BMI 26.90 kg/m  Pelvic Exam: cervix normal in appearance, external genitalia normal and vagina normal without discharge. Pap smear obtained.   Assessment:    Screening pap smear.   Plan:   Marland KitchenMarland KitchenDiagnoses and all orders for this visit:  Pap smear, as part of routine gynecological examination -     Cancel: Pap,Surepath Rflx HPV -     Cytology - PAP   Having monthly periods.  Abstinent.  Labs reviewed and no concerns.     Follow up in 5 years, or as indicated by Pap results.

## 2020-12-20 LAB — CYTOLOGY - PAP
Comment: NEGATIVE
Diagnosis: NEGATIVE
High risk HPV: NEGATIVE

## 2020-12-20 NOTE — Progress Notes (Signed)
Normal pap. Negative HPV and normal cells. Ok to go 5 years.

## 2023-09-24 ENCOUNTER — Ambulatory Visit (INDEPENDENT_AMBULATORY_CARE_PROVIDER_SITE_OTHER): Payer: Commercial Managed Care - PPO | Admitting: Physician Assistant

## 2023-09-24 VITALS — BP 130/79 | HR 85 | Ht 62.0 in | Wt 151.0 lb

## 2023-09-24 DIAGNOSIS — Z0000 Encounter for general adult medical examination without abnormal findings: Secondary | ICD-10-CM

## 2023-09-24 DIAGNOSIS — Z23 Encounter for immunization: Secondary | ICD-10-CM

## 2023-09-24 DIAGNOSIS — Z131 Encounter for screening for diabetes mellitus: Secondary | ICD-10-CM

## 2023-09-24 DIAGNOSIS — Z13220 Encounter for screening for lipoid disorders: Secondary | ICD-10-CM | POA: Diagnosis not present

## 2023-09-24 NOTE — Patient Instructions (Signed)

## 2023-09-24 NOTE — Progress Notes (Unsigned)
Complete physical exam  Patient: Crystal Cline   DOB: 05/08/1991   32 y.o. Female  MRN: [id number]  Subjective:    Chief Complaint  Patient presents with   Annual Exam    labs    Crystal Cline is a 32 y.o. female who presents today for a complete physical exam. She reports consuming a general diet.  Her mother makes her go outside and walk a few days a week.   She generally feels well. She reports sleeping well. She does not have additional problems to discuss today.    Most recent fall risk assessment:     No data to display           Most recent depression screenings:    09/11/2017   10:51 AM  PHQ 2/9 Scores  PHQ - 2 Score 0    Vision:Within last year and Dental: No current dental problems and Receives regular dental care  Patient Active Problem List   Diagnosis Date Noted   Family history of breast cancer 12/07/2020   Family history of ovarian cancer 12/07/2020   Vitamin D deficiency 09/12/2017   Change in stool caliber 09/12/2017   Blood in stool 09/11/2017   Malaise 09/11/2017   Chronic idiopathic constipation 09/11/2017   Generalized abdominal pain 09/11/2017   Sleep trouble 12/26/2015   Constipation 08/15/2014   Urinary frequency 05/11/2014   Nocturia 04/14/2014   OAB (overactive bladder) 04/12/2014   Frequent urination 04/12/2014   PCOS (polycystic ovarian syndrome) 03/23/2014   Elevated testosterone level in female 01/20/2014   Global developmental delay 01/12/2014   Autism 01/12/2014   Adjustment disorder with anxious mood 01/12/2014   Overbite 01/12/2014   Menorrhagia with irregular cycle 12/06/2013   Past Medical History:  Diagnosis Date   Autism    History of impacted cerumen    Family History  Problem Relation Age of Onset   Allergies Mother    Allergies Father    Cancer Maternal Grandfather    Cancer Paternal Grandmother    Breast cancer Paternal Grandmother    Cancer Paternal Grandfather    Ovarian cancer Paternal Aunt     No Known Allergies    Patient Care Team: Nolene Ebbs as PCP - General (Family Medicine)   Outpatient Medications Prior to Visit  Medication Sig   Multiple Vitamins tablet Take by mouth.   No facility-administered medications prior to visit.    ROS        Objective:     BP 130/79   Pulse 85   Ht 5\' 2"  (1.575 m)   Wt 151 lb (68.5 kg)   SpO2 99%   BMI 27.62 kg/m  BP Readings from Last 3 Encounters:  09/24/23 130/79  12/16/20 111/64  12/07/20 117/74   Wt Readings from Last 3 Encounters:  09/24/23 151 lb (68.5 kg)  12/16/20 147 lb 1.3 oz (66.7 kg)  12/07/20 147 lb 3.2 oz (66.8 kg)      Physical Exam      Assessment & Plan:    Routine Health Maintenance and Physical Exam  There is no immunization history for the selected administration types on file for this patient.  Health Maintenance  Topic Date Due   HIV Screening  Never done   Hepatitis C Screening  Never done   DTaP/Tdap/Td (1 - Tdap) Never done   COVID-19 Vaccine (1 - 2023-24 season) 10/10/2023 (Originally 08/04/2023)   INFLUENZA VACCINE  03/02/2024 (Originally 07/04/2023)   Cervical Cancer Screening (HPV/Pap  Cot[facility])  12/16/2025   HPV VACCINES  Aged Out    Discussed health benefits of physical activity, and encouraged her to engage in regular exercise appropriate for her age and condition.  Problem List Items Addressed This Visit   None  No follow-ups on file.     Tandy Gaw, PA-C

## 2023-09-25 LAB — CBC WITH DIFFERENTIAL/PLATELET
Basophils Absolute: 0 10*3/uL (ref 0.0–0.2)
Basos: 1 %
EOS (ABSOLUTE): 0.2 10*3/uL (ref 0.0–0.4)
Eos: 3 %
Hematocrit: 41.6 % (ref 34.0–46.6)
Hemoglobin: 13.4 g/dL (ref 11.1–15.9)
Immature Grans (Abs): 0 10*3/uL (ref 0.0–0.1)
Immature Granulocytes: 0 %
Lymphocytes Absolute: 2.2 10*3/uL (ref 0.7–3.1)
Lymphs: 40 %
MCH: 28.5 pg (ref 26.6–33.0)
MCHC: 32.2 g/dL (ref 31.5–35.7)
MCV: 88 fL (ref 79–97)
Monocytes Absolute: 0.4 10*3/uL (ref 0.1–0.9)
Monocytes: 8 %
Neutrophils Absolute: 2.6 10*3/uL (ref 1.4–7.0)
Neutrophils: 48 %
Platelets: 281 10*3/uL (ref 150–450)
RBC: 4.71 x10E6/uL (ref 3.77–5.28)
RDW: 12.7 % (ref 11.7–15.4)
WBC: 5.4 10*3/uL (ref 3.4–10.8)

## 2023-09-25 LAB — CMP14+EGFR
ALT: 12 [IU]/L (ref 0–32)
AST: 13 [IU]/L (ref 0–40)
Albumin: 4.7 g/dL (ref 3.9–4.9)
Alkaline Phosphatase: 51 [IU]/L (ref 44–121)
BUN/Creatinine Ratio: 22 (ref 9–23)
BUN: 13 mg/dL (ref 6–20)
Bilirubin Total: 0.4 mg/dL (ref 0.0–1.2)
CO2: 22 mmol/L (ref 20–29)
Calcium: 9.3 mg/dL (ref 8.7–10.2)
Chloride: 101 mmol/L (ref 96–106)
Creatinine, Ser: 0.6 mg/dL (ref 0.57–1.00)
Globulin, Total: 2.2 g/dL (ref 1.5–4.5)
Glucose: 89 mg/dL (ref 70–99)
Potassium: 3.9 mmol/L (ref 3.5–5.2)
Sodium: 136 mmol/L (ref 134–144)
Total Protein: 6.9 g/dL (ref 6.0–8.5)
eGFR: 121 mL/min/{1.73_m2} (ref >59)

## 2023-09-25 LAB — LIPID PANEL
Chol/HDL Ratio: 2.7 ratio (ref 0.0–4.4)
Cholesterol, Total: 183 mg/dL (ref 100–199)
HDL: 67 mg/dL (ref >39)
LDL Chol Calc (NIH): 107 mg/dL — ABNORMAL HIGH (ref 0–99)
Triglycerides: 48 mg/dL (ref 0–149)
VLDL Cholesterol Cal: 9 mg/dL (ref 5–40)

## 2023-09-25 LAB — TSH: TSH: 0.56 u[IU]/mL (ref 0.450–4.500)

## 2023-09-25 NOTE — Progress Notes (Signed)
Tyeasha,   Kidney, liver, glucose look great.  Thyroid normal.  No anemia and normal WBC.  Cholesterol looks good. LDL very close to optimal range.   Keep up good work and recheck in 1 year.

## 2024-09-25 ENCOUNTER — Encounter: Payer: Commercial Managed Care - PPO | Admitting: Physician Assistant
